# Patient Record
Sex: Female | Born: 1975 | Race: Black or African American | Hispanic: No | Marital: Single | Smoking: Current every day smoker
Health system: Southern US, Community
[De-identification: ages and names within clinical notes are randomized; demographics above are authoritative.]

## PROBLEM LIST (undated history)

## (undated) DIAGNOSIS — I10 Essential (primary) hypertension: Secondary | ICD-10-CM

## (undated) HISTORY — PX: TUBAL LIGATION: SHX77

## (undated) HISTORY — DX: Essential (primary) hypertension: I10

---

## 1997-04-12 ENCOUNTER — Inpatient Hospital Stay (HOSPITAL_COMMUNITY): Admission: AD | Admit: 1997-04-12 | Discharge: 1997-04-12 | Payer: Self-pay | Admitting: *Deleted

## 1997-04-14 ENCOUNTER — Other Ambulatory Visit: Admission: RE | Admit: 1997-04-14 | Discharge: 1997-04-14 | Payer: Self-pay | Admitting: Obstetrics and Gynecology

## 1997-04-14 ENCOUNTER — Other Ambulatory Visit: Admission: RE | Admit: 1997-04-14 | Discharge: 1997-04-14 | Payer: Self-pay | Admitting: Gynecology

## 1997-08-31 ENCOUNTER — Ambulatory Visit (HOSPITAL_COMMUNITY): Admission: RE | Admit: 1997-08-31 | Discharge: 1997-08-31 | Payer: Self-pay | Admitting: Obstetrics and Gynecology

## 1997-08-31 ENCOUNTER — Encounter: Payer: Self-pay | Admitting: Obstetrics and Gynecology

## 1997-11-23 ENCOUNTER — Inpatient Hospital Stay (HOSPITAL_COMMUNITY): Admission: AD | Admit: 1997-11-23 | Discharge: 1997-11-23 | Payer: Self-pay | Admitting: *Deleted

## 1997-11-24 ENCOUNTER — Inpatient Hospital Stay (HOSPITAL_COMMUNITY): Admission: AD | Admit: 1997-11-24 | Discharge: 1997-11-26 | Payer: Self-pay | Admitting: Obstetrics and Gynecology

## 1998-04-06 ENCOUNTER — Inpatient Hospital Stay (HOSPITAL_COMMUNITY): Admission: AD | Admit: 1998-04-06 | Discharge: 1998-04-06 | Payer: Self-pay | Admitting: Obstetrics & Gynecology

## 1998-04-08 ENCOUNTER — Inpatient Hospital Stay (HOSPITAL_COMMUNITY): Admission: AD | Admit: 1998-04-08 | Discharge: 1998-04-08 | Payer: Self-pay | Admitting: *Deleted

## 1998-10-05 ENCOUNTER — Emergency Department (HOSPITAL_COMMUNITY): Admission: EM | Admit: 1998-10-05 | Discharge: 1998-10-05 | Payer: Self-pay | Admitting: Emergency Medicine

## 2000-05-15 ENCOUNTER — Emergency Department (HOSPITAL_COMMUNITY): Admission: EM | Admit: 2000-05-15 | Discharge: 2000-05-15 | Payer: Self-pay | Admitting: Unknown Physician Specialty

## 2001-05-13 ENCOUNTER — Inpatient Hospital Stay (HOSPITAL_COMMUNITY): Admission: AD | Admit: 2001-05-13 | Discharge: 2001-05-13 | Payer: Self-pay | Admitting: Obstetrics and Gynecology

## 2001-09-15 ENCOUNTER — Other Ambulatory Visit: Admission: RE | Admit: 2001-09-15 | Discharge: 2001-09-15 | Payer: Self-pay | Admitting: Gynecology

## 2001-09-15 ENCOUNTER — Other Ambulatory Visit: Admission: RE | Admit: 2001-09-15 | Discharge: 2001-09-15 | Payer: Self-pay | Admitting: Obstetrics and Gynecology

## 2001-11-12 ENCOUNTER — Inpatient Hospital Stay (HOSPITAL_COMMUNITY): Admission: AD | Admit: 2001-11-12 | Discharge: 2001-11-12 | Payer: Self-pay | Admitting: Obstetrics & Gynecology

## 2002-03-13 ENCOUNTER — Inpatient Hospital Stay (HOSPITAL_COMMUNITY): Admission: AD | Admit: 2002-03-13 | Discharge: 2002-03-15 | Payer: Self-pay | Admitting: Obstetrics and Gynecology

## 2002-03-14 ENCOUNTER — Encounter (INDEPENDENT_AMBULATORY_CARE_PROVIDER_SITE_OTHER): Payer: Self-pay | Admitting: Specialist

## 2003-10-10 ENCOUNTER — Emergency Department (HOSPITAL_COMMUNITY): Admission: EM | Admit: 2003-10-10 | Discharge: 2003-10-10 | Payer: Self-pay | Admitting: Emergency Medicine

## 2003-11-09 ENCOUNTER — Emergency Department (HOSPITAL_COMMUNITY): Admission: EM | Admit: 2003-11-09 | Discharge: 2003-11-09 | Payer: Self-pay | Admitting: Family Medicine

## 2003-11-15 ENCOUNTER — Ambulatory Visit: Payer: Self-pay | Admitting: Internal Medicine

## 2003-11-21 ENCOUNTER — Ambulatory Visit: Payer: Self-pay | Admitting: Internal Medicine

## 2003-11-21 DIAGNOSIS — A5901 Trichomonal vulvovaginitis: Secondary | ICD-10-CM | POA: Insufficient documentation

## 2003-11-22 ENCOUNTER — Ambulatory Visit: Payer: Self-pay | Admitting: *Deleted

## 2003-12-13 ENCOUNTER — Ambulatory Visit: Payer: Self-pay | Admitting: Internal Medicine

## 2004-03-13 ENCOUNTER — Ambulatory Visit: Payer: Self-pay | Admitting: Internal Medicine

## 2004-03-16 ENCOUNTER — Ambulatory Visit: Payer: Self-pay | Admitting: Internal Medicine

## 2004-03-21 ENCOUNTER — Ambulatory Visit (HOSPITAL_COMMUNITY): Admission: RE | Admit: 2004-03-21 | Discharge: 2004-03-21 | Payer: Self-pay | Admitting: Internal Medicine

## 2004-03-23 ENCOUNTER — Ambulatory Visit: Payer: Self-pay | Admitting: Internal Medicine

## 2004-05-29 ENCOUNTER — Emergency Department (HOSPITAL_COMMUNITY): Admission: EM | Admit: 2004-05-29 | Discharge: 2004-05-29 | Payer: Self-pay | Admitting: Emergency Medicine

## 2005-10-18 ENCOUNTER — Emergency Department (HOSPITAL_COMMUNITY): Admission: EM | Admit: 2005-10-18 | Discharge: 2005-10-18 | Payer: Self-pay | Admitting: Emergency Medicine

## 2006-02-04 ENCOUNTER — Emergency Department (HOSPITAL_COMMUNITY): Admission: EM | Admit: 2006-02-04 | Discharge: 2006-02-04 | Payer: Self-pay | Admitting: Emergency Medicine

## 2006-02-24 ENCOUNTER — Ambulatory Visit: Payer: Self-pay | Admitting: Internal Medicine

## 2006-03-17 ENCOUNTER — Encounter (INDEPENDENT_AMBULATORY_CARE_PROVIDER_SITE_OTHER): Payer: Self-pay | Admitting: *Deleted

## 2006-03-17 ENCOUNTER — Ambulatory Visit: Payer: Self-pay | Admitting: Internal Medicine

## 2006-09-19 ENCOUNTER — Telehealth (INDEPENDENT_AMBULATORY_CARE_PROVIDER_SITE_OTHER): Payer: Self-pay | Admitting: *Deleted

## 2006-09-22 ENCOUNTER — Encounter (INDEPENDENT_AMBULATORY_CARE_PROVIDER_SITE_OTHER): Payer: Self-pay | Admitting: Internal Medicine

## 2006-09-22 DIAGNOSIS — F17200 Nicotine dependence, unspecified, uncomplicated: Secondary | ICD-10-CM | POA: Insufficient documentation

## 2006-09-24 ENCOUNTER — Ambulatory Visit: Payer: Self-pay | Admitting: Nurse Practitioner

## 2006-09-24 DIAGNOSIS — M7710 Lateral epicondylitis, unspecified elbow: Secondary | ICD-10-CM | POA: Insufficient documentation

## 2006-10-01 ENCOUNTER — Encounter (INDEPENDENT_AMBULATORY_CARE_PROVIDER_SITE_OTHER): Payer: Self-pay | Admitting: *Deleted

## 2007-04-22 ENCOUNTER — Ambulatory Visit: Payer: Self-pay | Admitting: Nurse Practitioner

## 2007-04-22 DIAGNOSIS — L219 Seborrheic dermatitis, unspecified: Secondary | ICD-10-CM | POA: Insufficient documentation

## 2007-04-23 ENCOUNTER — Encounter (INDEPENDENT_AMBULATORY_CARE_PROVIDER_SITE_OTHER): Payer: Self-pay | Admitting: Nurse Practitioner

## 2007-05-11 ENCOUNTER — Ambulatory Visit: Payer: Self-pay | Admitting: Nurse Practitioner

## 2007-05-11 DIAGNOSIS — R319 Hematuria, unspecified: Secondary | ICD-10-CM

## 2007-05-11 LAB — CONVERTED CEMR LAB
Ketones, urine, test strip: NEGATIVE
Nitrite: NEGATIVE
Specific Gravity, Urine: 1.025
WBC Urine, dipstick: NEGATIVE
pH: 5.5

## 2007-05-12 ENCOUNTER — Encounter (INDEPENDENT_AMBULATORY_CARE_PROVIDER_SITE_OTHER): Payer: Self-pay | Admitting: Nurse Practitioner

## 2007-05-15 ENCOUNTER — Encounter (INDEPENDENT_AMBULATORY_CARE_PROVIDER_SITE_OTHER): Payer: Self-pay | Admitting: Nurse Practitioner

## 2007-07-10 ENCOUNTER — Ambulatory Visit: Payer: Self-pay | Admitting: Nurse Practitioner

## 2007-07-10 DIAGNOSIS — I10 Essential (primary) hypertension: Secondary | ICD-10-CM

## 2007-07-10 DIAGNOSIS — B354 Tinea corporis: Secondary | ICD-10-CM | POA: Insufficient documentation

## 2007-07-20 ENCOUNTER — Encounter (INDEPENDENT_AMBULATORY_CARE_PROVIDER_SITE_OTHER): Payer: Self-pay | Admitting: Nurse Practitioner

## 2007-07-20 ENCOUNTER — Ambulatory Visit (HOSPITAL_COMMUNITY): Admission: RE | Admit: 2007-07-20 | Discharge: 2007-07-20 | Payer: Self-pay | Admitting: Internal Medicine

## 2007-07-29 ENCOUNTER — Ambulatory Visit: Payer: Self-pay | Admitting: Nurse Practitioner

## 2007-09-17 ENCOUNTER — Emergency Department (HOSPITAL_COMMUNITY): Admission: EM | Admit: 2007-09-17 | Discharge: 2007-09-17 | Payer: Self-pay | Admitting: Emergency Medicine

## 2007-12-15 ENCOUNTER — Ambulatory Visit: Payer: Self-pay | Admitting: Nurse Practitioner

## 2007-12-15 DIAGNOSIS — G4700 Insomnia, unspecified: Secondary | ICD-10-CM | POA: Insufficient documentation

## 2007-12-15 LAB — CONVERTED CEMR LAB
Bilirubin Urine: NEGATIVE
KOH Prep: NEGATIVE
Ketones, urine, test strip: NEGATIVE
Nitrite: NEGATIVE
Specific Gravity, Urine: 1.02
Urobilinogen, UA: 1
WBC Urine, dipstick: NEGATIVE
pH: 6

## 2007-12-16 ENCOUNTER — Encounter (INDEPENDENT_AMBULATORY_CARE_PROVIDER_SITE_OTHER): Payer: Self-pay | Admitting: Nurse Practitioner

## 2007-12-21 ENCOUNTER — Emergency Department (HOSPITAL_COMMUNITY): Admission: EM | Admit: 2007-12-21 | Discharge: 2007-12-21 | Payer: Self-pay | Admitting: Family Medicine

## 2007-12-22 ENCOUNTER — Encounter (INDEPENDENT_AMBULATORY_CARE_PROVIDER_SITE_OTHER): Payer: Self-pay | Admitting: Nurse Practitioner

## 2008-05-09 ENCOUNTER — Ambulatory Visit: Payer: Self-pay | Admitting: Nurse Practitioner

## 2008-05-09 ENCOUNTER — Encounter (INDEPENDENT_AMBULATORY_CARE_PROVIDER_SITE_OTHER): Payer: Self-pay | Admitting: Nurse Practitioner

## 2008-05-09 DIAGNOSIS — B373 Candidiasis of vulva and vagina: Secondary | ICD-10-CM

## 2008-05-09 LAB — CONVERTED CEMR LAB

## 2008-05-10 ENCOUNTER — Encounter (INDEPENDENT_AMBULATORY_CARE_PROVIDER_SITE_OTHER): Payer: Self-pay | Admitting: Nurse Practitioner

## 2008-10-16 ENCOUNTER — Emergency Department (HOSPITAL_COMMUNITY): Admission: EM | Admit: 2008-10-16 | Discharge: 2008-10-17 | Payer: Self-pay | Admitting: Emergency Medicine

## 2008-12-04 ENCOUNTER — Emergency Department (HOSPITAL_COMMUNITY): Admission: EM | Admit: 2008-12-04 | Discharge: 2008-12-04 | Payer: Self-pay | Admitting: Emergency Medicine

## 2009-03-06 ENCOUNTER — Ambulatory Visit: Payer: Self-pay | Admitting: Nurse Practitioner

## 2009-03-06 DIAGNOSIS — N760 Acute vaginitis: Secondary | ICD-10-CM | POA: Insufficient documentation

## 2009-03-06 LAB — CONVERTED CEMR LAB
Bilirubin Urine: NEGATIVE
Glucose, Urine, Semiquant: NEGATIVE
KOH Prep: NEGATIVE
Specific Gravity, Urine: <1.005
pH: 8

## 2009-04-27 ENCOUNTER — Telehealth (INDEPENDENT_AMBULATORY_CARE_PROVIDER_SITE_OTHER): Payer: Self-pay | Admitting: Nurse Practitioner

## 2009-05-18 ENCOUNTER — Ambulatory Visit: Payer: Self-pay | Admitting: Nurse Practitioner

## 2009-05-18 LAB — CONVERTED CEMR LAB
BUN: 5 mg/dL — ABNORMAL LOW (ref 6–23)
Basophils Relative: 1 % (ref 0–1)
Bilirubin Urine: NEGATIVE
CO2: 27 meq/L (ref 19–32)
Calcium: 10.1 mg/dL (ref 8.4–10.5)
Chloride: 97 meq/L (ref 96–112)
Cholesterol: 168 mg/dL (ref 0–200)
Creatinine, Ser: 0.66 mg/dL (ref 0.40–1.20)
Eosinophils Absolute: 0.1 10*3/uL (ref 0.0–0.7)
Eosinophils Relative: 1 % (ref 0–5)
Glucose, Urine, Semiquant: NEGATIVE
HCT: 36.7 % (ref 36.0–46.0)
HDL: 78 mg/dL (ref >39)
KOH Prep: NEGATIVE
Ketones, urine, test strip: NEGATIVE
LDL Cholesterol: 73 mg/dL (ref 0–99)
Lymphs Abs: 2.2 10*3/uL (ref 0.7–4.0)
MCHC: 32.2 g/dL (ref 30.0–36.0)
MCV: 87.6 fL (ref 78.0–100.0)
Microalb, Ur: 0.67 mg/dL (ref 0.00–1.89)
Monocytes Relative: 9 % (ref 3–12)
Platelets: 494 10*3/uL — ABNORMAL HIGH (ref 150–400)
Rapid HIV Screen: NEGATIVE
Specific Gravity, Urine: 1.01
TSH: 1.016 microintl units/mL (ref 0.350–4.500)
Total Bilirubin: 0.3 mg/dL (ref 0.3–1.2)
Total CHOL/HDL Ratio: 2.2
Urobilinogen, UA: 0.2
WBC: 6.5 10*3/uL (ref 4.0–10.5)
pH: 7

## 2009-05-22 ENCOUNTER — Encounter (INDEPENDENT_AMBULATORY_CARE_PROVIDER_SITE_OTHER): Payer: Self-pay | Admitting: Nurse Practitioner

## 2009-05-22 LAB — CONVERTED CEMR LAB: Pap Smear: NEGATIVE

## 2009-05-23 ENCOUNTER — Encounter (INDEPENDENT_AMBULATORY_CARE_PROVIDER_SITE_OTHER): Payer: Self-pay | Admitting: Nurse Practitioner

## 2009-06-15 ENCOUNTER — Encounter (INDEPENDENT_AMBULATORY_CARE_PROVIDER_SITE_OTHER): Payer: Self-pay | Admitting: Nurse Practitioner

## 2010-02-11 LAB — CONVERTED CEMR LAB
Albumin: 4.5 g/dL (ref 3.5–5.2)
Albumin: 4.8 g/dL (ref 3.5–5.2)
Alkaline Phosphatase: 46 units/L (ref 39–117)
Alkaline Phosphatase: 53 units/L (ref 39–117)
BUN: 8 mg/dL (ref 6–23)
BUN: 9 mg/dL (ref 6–23)
Basophils Absolute: 0 10*3/uL (ref 0.0–0.1)
Basophils Relative: 1 % (ref 0–1)
CO2: 20 meq/L (ref 19–32)
CO2: 24 meq/L (ref 19–32)
Cholesterol: 137 mg/dL (ref 0–200)
Eosinophils Absolute: 0.1 10*3/uL (ref 0.0–0.7)
Eosinophils Relative: 1 % (ref 0–5)
Eosinophils Relative: 2 % (ref 0–5)
Glucose, Bld: 74 mg/dL (ref 70–99)
Glucose, Bld: 94 mg/dL (ref 70–99)
HCT: 37.3 % (ref 36.0–46.0)
HCT: 39.5 % (ref 36.0–46.0)
HDL: 69 mg/dL (ref >39)
Hemoglobin: 11.8 g/dL — ABNORMAL LOW (ref 12.0–15.0)
Lymphocytes Relative: 31 % (ref 12–46)
Lymphs Abs: 2.1 10*3/uL (ref 0.7–4.0)
MCHC: 31.6 g/dL (ref 30.0–36.0)
MCV: 85.5 fL (ref 78.0–100.0)
Microalb, Ur: 0.8 mg/dL (ref 0.00–1.89)
Monocytes Absolute: 0.6 10*3/uL (ref 0.1–1.0)
Monocytes Relative: 10 % (ref 3–12)
Monocytes Relative: 9 % (ref 3–12)
Neutro Abs: 3.7 10*3/uL (ref 1.7–7.7)
Neutrophils Relative %: 62 % (ref 43–77)
Nitrite: NEGATIVE
Potassium: 4 meq/L (ref 3.5–5.3)
Potassium: 4.3 meq/L (ref 3.5–5.3)
Protein, U semiquant: NEGATIVE
RBC: 4.37 M/uL (ref 3.87–5.11)
RBC: 4.62 M/uL (ref 3.87–5.11)
RDW: 15.4 % (ref 11.5–15.5)
Sodium: 135 meq/L (ref 135–145)
Specific Gravity, Urine: 1.02
TSH: 0.7 microintl units/mL (ref 0.350–5.50)
Total Protein: 7.5 g/dL (ref 6.0–8.3)
Triglycerides: 102 mg/dL (ref <150)
Urobilinogen, UA: 0.2
Urobilinogen, UA: 0.2
WBC Urine, dipstick: NEGATIVE
WBC Urine, dipstick: NEGATIVE
WBC: 7.4 10*3/uL (ref 4.0–10.5)

## 2010-02-15 NOTE — Letter (Signed)
Summary: *HSN Results Follow up  [facility]  [address] [place], [place] [address]   Phone: [phone]  Fax: [phone]      06/15/2009   MAKYNLEE KRESSIN [address] [place], [place]  [address]   Dear  Ms. Tanya Nones,                            ____S.Drinkard,FNP   ____D. Gore,FNP       ____B. McPherson,MD   ____V. Rankins,MD    ____E. Mulberry,MD    _X___N. Daphine Deutscher, FNP  ____D. Reche Dixon, MD    ____K. Philipp Deputy, MD    ____Other     This letter is to inform you that your recent test(s):  __X_____Pap Smear    _______Lab Test     _______X-ray    ___X____ is within acceptable limits  _______ requires a medication change  _______ requires a follow-up lab visit  _______ requires a follow-up visit with your provider   Comments: Pap Smear results normal.       _________________________________________________________ If you have any questions, please contact our office [phone].                    Sincerely,    Lehman Prom FNP [facility]

## 2010-02-15 NOTE — Letter (Signed)
Summary: Handout Printed  Printed Handout:  - Bacterial Vaginosis-Brief 

## 2010-02-15 NOTE — Assessment & Plan Note (Signed)
Summary: Acute - Bacterial Vaginosis   Vital Signs:  Patient profile:   35 year old female LMP:     01/2009 Weight:      116.2 pounds BMI:     20.66 BSA:     1.54 Temp:     98.0 degrees F oral Pulse rate:   85 / minute Pulse rhythm:   regular Resp:     20 per minute BP sitting:   124 / 90  (left arm) Cuff size:   regular  Vitals Entered By: Levon Hedger (March 06, 2009 10:14 AM) CC: vaginal discharge and odor x 2 weeks...refill on BP meds, Hypertension Management Is Patient Diabetic? No Pain Assessment Patient in pain? no       Does patient need assistance? Functional Status Self care Ambulation Normal LMP (date): 01/2009 LMP - Character: heavy     Enter LMP: 01/2009 Last PAP Result  Specimen Adequacy: Satisfactory for evaluation.   Interpretation/Result:Negative for intraepithelial Lesion or Malignancy.      CC:  vaginal discharge and odor x 2 weeks...refill on BP meds and Hypertension Management.  History of Present Illness:  Pt into the office with complaints of vaginal discharge Started about 2 weeks ago. LMP - at the end of January, none for February yet. +odorous sometimes but not all the time +thin, watery discharge no need to wear pantyliners -abd pain -dysuria -hematuria   Notes that she is just getting over what she thinks was the flu on last week.  She did not get the Flu vaccine in 2010  Hypertension History:      She denies headache, chest pain, and palpitations.  She notes no problems with any antihypertensive medication side effects.  Pt has taken her BP meds today.  Further comments include: Pharmacy report indicates that pt she has not picked up lisinopril since 11/10 and original Rx sent on 12/2007.        Positive major cardiovascular risk factors include hypertension and current tobacco user.  Negative major cardiovascular risk factors include female age less than 60 years old, no history of diabetes, and negative family history for  ischemic heart disease.        Further assessment for target organ damage reveals no history of ASHD, cardiac end-organ damage (CHF/LVH), stroke/TIA, peripheral vascular disease, renal insufficiency, or hypertensive retinopathy.     Allergies (verified): No Known Drug Allergies  Review of Systems CV:  Denies chest pain or discomfort. Resp:  Denies cough. GI:  Denies abdominal pain, nausea, and vomiting. GU:  Complains of discharge; denies dysuria.  Physical Exam  General:  alert.   Head:  normocephalic.   Lungs:  normal breath sounds.   Heart:  normal rate and regular rhythm.   Abdomen:  normal bowel sounds.   Msk:  normal ROM.   Neurologic:  alert & oriented X3.   Skin:  color normal.   Psych:  Oriented X3.     Impression & Recommendations:  Problem # 1:  BACTERIAL VAGINITIS (ICD-616.10) Dx reviewed with pt Her updated medication list for this problem includes:    Metronidazole 0.75 % Gel (Metronidazole) ..... One applicator intravaginally x 5 nights  Orders: Wet Prep ([address]UE)  Problem # 2:  HYPERTENSION, BENIGN ESSENTIAL (ICD-401.1) DASH diet pt advised to take meds as ordered Her updated medication list for this problem includes:    Lisinopril 10 Mg Tabs (Lisinopril) .Marland Kitchen... 1 tablet by mouth daily  Complete Medication List: 1)  Lisinopril 10 Mg Tabs (Lisinopril) .Marland KitchenMarland KitchenMarland Kitchen  1 tablet by mouth daily 2)  Metronidazole 0.75 % Gel (Metronidazole) .... One applicator intravaginally x 5 nights  Other Orders: UA Dipstick w/o Micro (manual) (81191)  Hypertension Assessment/Plan:      The patient's hypertensive risk group is category B: At least one risk factor (excluding diabetes) with no target organ damage.  Her calculated 10 year risk of coronary heart disease is 1 %.  Today's blood pressure is 124/90.  Her blood pressure goal is < 140/90.  Patient Instructions: 1)  You complete physical exam will be due after May 09, 2009. 2)  Do not eat after midnight before this  visit. 3)  You will get labs, PAP, EKG, PHQ-9 4)  You have bacterial vaginosis 5)  Be sure to drink plenty of water 6)  white cotton underwear 7)  wipe from front to back 8)  go to the bathroom after intercouse 9)  DO not douche Prescriptions: METRONIDAZOLE 0.75 % GEL (METRONIDAZOLE) One applicator intravaginally x 5 nights  #45gm x 0   Entered and Authorized by:   Lehman Prom FNP   Signed by:   Lehman Prom FNP on 03/06/2009   Method used:   Print then Give to Patient   RxID:   [id number] LISINOPRIL 10 MG  TABS (LISINOPRIL) 1 tablet by mouth daily  #30 x 5   Entered and Authorized by:   Lehman Prom FNP   Signed by:   Lehman Prom FNP on 03/06/2009   Method used:   Print then Give to Patient   RxID:   [id number]   Laboratory Results   Urine Tests  Date/Time Received: March 06, 2009 10:38 AM   Routine Urinalysis   Color: lt. yellow Glucose: negative   (Normal Range: Negative) Bilirubin: negative   (Normal Range: Negative) Ketone: smal (15)   (Normal Range: Negative) Spec. Gravity: <1.005   (Normal Range: 1.003-1.035) Blood: trace-intact   (Normal Range: Negative) pH: 8.0   (Normal Range: 5.0-8.0) Protein: 30   (Normal Range: Negative) Urobilinogen: 0.2   (Normal Range: 0-1) Nitrite: negative   (Normal Range: Negative) Leukocyte Esterace: negative   (Normal Range: Negative)    Date/Time Received: March 06, 2009 10:56 AM   Wet Mount/KOH Source: vaginal WBC/hpf: 1-5 Bacteria/hpf: rare Clue cells/hpf: few Yeast/hpf: none Trichomonas/hpf: none   Laboratory Results   Urine Tests    Routine Urinalysis   Color: lt. yellow Glucose: negative   (Normal Range: Negative) Bilirubin: negative   (Normal Range: Negative) Ketone: smal (15)   (Normal Range: Negative) Spec. Gravity: <1.005   (Normal Range: 1.003-1.035) Blood: trace-intact   (Normal Range: Negative) pH: 8.0   (Normal Range: 5.0-8.0) Protein: 30   (Normal Range:  Negative) Urobilinogen: 0.2   (Normal Range: 0-1) Nitrite: negative   (Normal Range: Negative) Leukocyte Esterace: negative   (Normal Range: Negative)      Wet Mount Wet Mount KOH: Negative

## 2010-02-15 NOTE — Progress Notes (Signed)
Summary: Office Visit//DEPRESSION SCREENING  Office Visit//DEPRESSION SCREENING   Imported By: Arta Bruce 07/14/2009 11:27:50  _____________________________________________________________________  External Attachment:    Type:   Image     Comment:   External Document

## 2010-02-15 NOTE — Assessment & Plan Note (Signed)
Summary: Complete Physical Exam   Vital Signs:  Patient profile:   35 year old female LMP:     04/27/2009 Height:      63. inches Weight:      120.0 pounds Temp:     97.5 degrees F oral Pulse rate:   54 / minute Pulse rhythm:   regular Resp:     16 per minute BP sitting:   154 / 104  (left arm) Cuff size:   regular  Vitals Entered By: Levon Hedger (May 18, 2009 10:13 AM) CC: CPP...hot flashes...having some irregular spotting, Hypertension Management Is Patient Diabetic? No Pain Assessment Patient in pain? no       Does patient need assistance? Functional Status Self care Ambulation Normal LMP (date): 04/27/2009 LMP - Character: heavy     Enter LMP: 04/27/2009 Last PAP Result  Specimen Adequacy: Satisfactory for evaluation.   Interpretation/Result:Negative for intraepithelial Lesion or Malignancy.      CC:  CPP...hot flashes...having some irregular spotting and Hypertension Management.  History of Present Illness:  Pt into the office for a complete physical exam.  Pap - last year in this office.  normal.  No hx of abnormal PAP  no family hx of cervical or ovarian cancer  2 children - natural Menses - irreguar - never been regular since onset; no current birth control  Mammogram - no hx of mammogram no family hx of breast cancer self breast exams at home  Optho - wears glasses for driving. no recent eye exams.  dental - no recent dental exam  Tdap - up to date  Hypertension History:      She denies headache, chest pain, and palpitations.  She notes no problems with any antihypertensive medication side effects.  Pt has already taken BP meds today.  no bp checks at home.        Positive major cardiovascular risk factors include hypertension and current tobacco user.  Negative major cardiovascular risk factors include female age less than 39 years old, no history of diabetes, and negative family history for ischemic heart disease.        Further assessment  for target organ damage reveals no history of ASHD, cardiac end-organ damage (CHF/LVH), stroke/TIA, peripheral vascular disease, renal insufficiency, or hypertensive retinopathy.     Habits & Providers  Alcohol-Tobacco-Diet     Alcohol type: beer, wine     Tobacco Status: current     Tobacco Counseling: to quit use of tobacco products     Cigarette Packs/Day: 1/3  Exercise-Depression-Behavior     Does Patient Exercise: no     Exercise Counseling: to improve exercise regimen     Have you felt down or hopeless? no     Have you felt little pleasure in things? no     Depression Counseling: not indicated; screening negative for depression     Drug Use: no     Seat Belt Use: 100     Sun Exposure: occasionally  Comments: PHQ-9 score = 0  Allergies (verified): No Known Drug Allergies  Review of Systems General:  Complains of sweats; denies fever; mostly at night. Eyes:  Denies blurring. ENT:  Denies earache. CV:  Denies chest pain or discomfort. Resp:  Denies cough. GI:  Denies abdominal pain, nausea, and vomiting. GU:  Denies discharge. MS:  Denies joint pain. Derm:  Denies rash. Neuro:  Denies headaches. Psych:  Denies anxiety and depression.  Physical Exam  General:  alert.   Head:  normocephalic.  Eyes:  pupils reactive to light.   Ears:  ear piercing(s) noted.  bil TM with bony landmarks present Nose:  no nasal discharge.   Mouth:  fair dentition.  some missing teeth with some cavities Neck:  supple.   Chest Wall:  no mass.   Breasts:  no masses.   Lungs:  normal breath sounds.   Heart:  normal rate and regular rhythm.   Abdomen:  soft, non-tender, and normal bowel sounds.   Rectal:  defer Msk:  no limits Pulses:  R radial normal and L radial normal.   Extremities:  no edema Neurologic:  alert & oriented X3.   Skin:  discoloration to face - birth mark Psych:  Oriented X3.    Pelvic Exam  Vulva:      normal appearance.   Urethra and Bladder:       Urethra--no discharge.   Vagina:      physiologic discharge.   Cervix:      midposition.   Uterus:      smooth.   Adnexa:      nontender bilaterally.      Impression & Recommendations:  Problem # 1:  ROUTINE GYNECOLOGICAL EXAMINATION (ICD-V72.31) labs done PAP done self breast exam reinforced with pt PHQ- 9 score = 0 rec optho and dental exam Orders: T- GC Chlamydia (56213) KOH/ WET Mount [phone]) Pap Smear, Thin Prep ( Collection of) (Q4696) T-CBC w/Diff ([id number]) Rapid HIV  (40102) T-TSH ([id number])  Problem # 2:  HYPERTENSION, BENIGN ESSENTIAL (ICD-401.1) BP elevated today in office  usually well controlled advised pt to monitor discussed DASH diet Her updated medication list for this problem includes:    Lisinopril 10 Mg Tabs (Lisinopril) .Marland Kitchen... 1 tablet by mouth daily  Orders: UA Dipstick w/o Micro (manual) ([id number]) T-Urine Microalbumin w/creat. ratio [phone]) T-Lipid Profile [id number]) T-Comprehensive Metabolic Panel ([id number])  Problem # 3:  DISORDER, TOBACCO USE (ICD-305.1) advised pt to stop smoking  Complete Medication List: 1)  Lisinopril 10 Mg Tabs (Lisinopril) .Marland Kitchen.. 1 tablet by mouth daily  Hypertension Assessment/Plan:      The patient's hypertensive risk group is category B: At least one risk factor (excluding diabetes) with no target organ damage.  Her calculated 10 year risk of coronary heart disease is 1 %.  Today's blood pressure is 154/104.  Her blood pressure goal is < 140/90.  Patient Instructions: 1)  You will be notified of any abnormal labs. 2)  Sweating - may be some hormonal during the night. 3)  Be sure to continue to drink plenty of water 4)  Follow up at least every 6 months for your blood pressure or sooner if necessary  Laboratory Results   Urine Tests  Date/Time Received: May 18, 2009 10:22 AM   Routine Urinalysis   Color: lt. yellow Appearance: Clear Glucose: negative   (Normal Range:  Negative) Bilirubin: negative   (Normal Range: Negative) Ketone: negative   (Normal Range: Negative) Spec. Gravity: 1.010   (Normal Range: 1.003-1.035) Blood: small   (Normal Range: Negative) pH: 7.0   (Normal Range: 5.0-8.0) Protein: negative   (Normal Range: Negative) Urobilinogen: 0.2   (Normal Range: 0-1) Nitrite: negative   (Normal Range: Negative) Leukocyte Esterace: negative   (Normal Range: Negative)    Date/Time Received: May 18, 2009 11:29 AM   Wet Mount/KOH Source: vaginal WBC/hpf: 1-5 Bacteria/hpf: rare Clue cells/hpf: none Yeast/hpf: none Trichomonas/hpf: none  Other Tests  Rapid HIV: negative   Prevention & Chronic Care Immunizations  Influenza vaccine: Fluvax 3+  (12/15/2007)    Tetanus booster: 09/24/2006: Tdap    Pneumococcal vaccine: Not documented  Other Screening   Pap smear:  Specimen Adequacy: Satisfactory for evaluation.   Interpretation/Result:Negative for intraepithelial Lesion or Malignancy.     (05/09/2008)   Pap smear action/deferral: Ordered  (05/18/2009)   Pap smear due: 05/19/2010   Smoking status: current  (05/18/2009)   Smoking cessation counseling: yes  (07/10/2007)  Hypertension   Last Blood Pressure: 154 / 104  (05/18/2009)   Serum creatinine: 0.72  (05/09/2008)   Serum potassium 4.3  (05/09/2008) CMP ordered   Self-Management Support :   Personal Goals (by the next clinic visit) :      Personal blood pressure goal: 130/80  (05/18/2009)   Patient will work on the following items until the next clinic visit to reach self-care goals:     Medications and monitoring: check my blood pressure  (05/18/2009)     Eating: eat more vegetables  (05/18/2009)     Activity: take a 30 minute walk every day  (05/18/2009)    Hypertension self-management support: Not documented   Laboratory Results   Urine Tests    Routine Urinalysis   Color: lt. yellow Appearance: Clear Glucose: negative   (Normal Range:  Negative) Bilirubin: negative   (Normal Range: Negative) Ketone: negative   (Normal Range: Negative) Spec. Gravity: 1.010   (Normal Range: 1.003-1.035) Blood: small   (Normal Range: Negative) pH: 7.0   (Normal Range: 5.0-8.0) Protein: negative   (Normal Range: Negative) Urobilinogen: 0.2   (Normal Range: 0-1) Nitrite: negative   (Normal Range: Negative) Leukocyte Esterace: negative   (Normal Range: Negative)      Wet Mount Wet Mount KOH: Negative  Other Tests  Rapid HIV: negative    Appended Document: Complete Physical Exam     Serial Vital Signs/Assessments:  Time      Position  BP       Pulse  Resp  Temp     By           R Arm     163/117                        Levon Hedger   Allergies: No Known Drug Allergies   Complete Medication List: 1)  Lisinopril 10 Mg Tabs (Lisinopril) .Marland Kitchen.. 1 tablet by mouth daily

## 2010-02-15 NOTE — Progress Notes (Signed)
Summary: Sweating   Phone Note Call from Patient Call back at [facility] Phone [phone] Call back at [phone]   Summary of Call: The pt feels very hot recently and she doesn't know why. Please call her back. Martin TnP Initial call taken by: Manon Hilding,  April 27, 2009 5:00 PM  Follow-up for Phone Call        spoke with pt and she says she gets real hot while she is moving around (cleaning, washing dishes, etc.) and she has to lay down to cool off.... pt says it will take a minute before she gets hot again... pt says she does sweat when she gets hot... Follow-up by: [place] Shannon,  April 28, 2009 1:40 PM  Additional Follow-up for Phone Call Additional follow up Details #1::        I'm not sure what this could be from.... advise pt to drink plenty of water. avoid caffeine Additional Follow-up by: Lehman Prom FNP,  April 28, 2009 2:04 PM    Additional Follow-up for Phone Call Additional follow up Details #2::    Left message on answering machine for pt to call back.Marland KitchenMarland KitchenArmenia Shannon  May 01, 2009 3:37 PM  spoke with pt and she feels as if she needs to make appt since all she do is drink water... pt says she might drink a soda once daily.... [place] Shannon  May 02, 2009 3:07 PM

## 2010-04-18 LAB — CULTURE, ROUTINE-ABSCESS

## 2010-04-19 LAB — ETHANOL: Alcohol, Ethyl (B): 313 mg/dL — ABNORMAL HIGH (ref 0–10)

## 2010-04-19 LAB — RAPID URINE DRUG SCREEN, HOSP PERFORMED
Cocaine: NOT DETECTED
Opiates: NOT DETECTED
Tetrahydrocannabinol: NOT DETECTED

## 2010-04-19 LAB — COMPREHENSIVE METABOLIC PANEL
ALT: 9 U/L (ref 0–35)
Alkaline Phosphatase: 47 U/L (ref 39–117)
BUN: 7 mg/dL (ref 6–23)
CO2: 22 mEq/L (ref 19–32)
GFR calc non Af Amer: >60 mL/min (ref >60)
Glucose, Bld: 95 mg/dL (ref 70–99)
Potassium: 3.5 mEq/L (ref 3.5–5.1)
Sodium: 142 mEq/L (ref 135–145)
Total Bilirubin: 0.5 mg/dL (ref 0.3–1.2)

## 2010-04-19 LAB — POCT PREGNANCY, URINE: Preg Test, Ur: NEGATIVE

## 2010-04-19 LAB — POCT I-STAT, CHEM 8
Creatinine, Ser: 0.7 mg/dL (ref 0.4–1.2)
HCT: 39 % (ref 36.0–46.0)
Hemoglobin: 13.3 g/dL (ref 12.0–15.0)
Potassium: 3.4 mEq/L — ABNORMAL LOW (ref 3.5–5.1)
Sodium: 143 mEq/L (ref 135–145)

## 2010-04-19 LAB — DIFFERENTIAL
Basophils Absolute: 0 10*3/uL (ref 0.0–0.1)
Basophils Relative: 1 % (ref 0–1)
Eosinophils Absolute: 0 10*3/uL (ref 0.0–0.7)
Neutro Abs: 3.3 10*3/uL (ref 1.7–7.7)
Neutrophils Relative %: 48 % (ref 43–77)

## 2010-04-19 LAB — URINALYSIS, ROUTINE W REFLEX MICROSCOPIC
Bilirubin Urine: NEGATIVE
Specific Gravity, Urine: 1.007 (ref 1.005–1.030)
Urobilinogen, UA: 0.2 mg/dL (ref 0.0–1.0)
pH: 6 (ref 5.0–8.0)

## 2010-04-19 LAB — POCT CARDIAC MARKERS: Myoglobin, poc: 105 ng/mL (ref 12–200)

## 2010-04-19 LAB — CBC
HCT: 34.9 % — ABNORMAL LOW (ref 36.0–46.0)
Hemoglobin: 11.6 g/dL — ABNORMAL LOW (ref 12.0–15.0)
MCHC: 33.3 g/dL (ref 30.0–36.0)
RBC: 3.92 MIL/uL (ref 3.87–5.11)
RDW: 15.9 % — ABNORMAL HIGH (ref 11.5–15.5)

## 2010-04-19 LAB — URINE MICROSCOPIC-ADD ON

## 2010-04-19 LAB — LIPASE, BLOOD: Lipase: 54 U/L (ref 11–59)

## 2010-04-19 LAB — D-DIMER, QUANTITATIVE: D-Dimer, Quant: 0.72 ug/mL-FEU — ABNORMAL HIGH (ref 0.00–0.48)

## 2010-06-01 NOTE — Op Note (Signed)
NAME:  Erin Case, Erin Case                      ACCOUNT NO.:  [id number]   MEDICAL RECORD NO.:  [id number]                   PATIENT TYPE:  INP   LOCATION:  9160                                 FACILITY:  [facility]   PHYSICIAN:  Randye Lobo, M.D.                DATE OF BIRTH:  1975-10-17   DATE OF PROCEDURE:  03/13/2002  DATE OF DISCHARGE:                                 OPERATIVE REPORT   PREOPERATIVE DIAGNOSES:  1. Intrauterine gestation at 40+ four weeks.  2. Nonreassuring fetal assessment.   POSTOPERATIVE DIAGNOSES:  1. Intrauterine gestation at 40+ four weeks.  2. Nonreassuring fetal assessment.   PROCEDURE:  Vacuum-assisted vaginal delivery, with midline episiotomy and  rupture.   ANESTHESIA:  Epidural, local with 1% lidocaine.   ESTIMATED BLOOD LOSS:  400 cc.   COMPLICATIONS:  None.   INDICATIONS FOR PROCEDURE:  The patient is a 35 year old gravida 2, para 1-0-  0-1 African-American female; who presented to the [facility] at 40+  four weeks gestation, early in the morning of 03/13/02 with contractions.  The patient was noted to have spontaneous rupture of membranes during her  initial vaginal examination in the Triage area, at which time the cervix was  5 cm dilated, 90% effaced, and with the vertex at the -1 station.  The  amniotic fluid was noted to be clear.  The patient was admitted in labor.  She went on to receive an epidural for anesthesia.  The patient was having  mild to moderate variable decelerations of the fetal heart tracing, and an  amnio infusion was therefore performed.  A variable subsequently resolved.  The patient had Pitocin augmentation of her labor as her contractions were  spacing out to  5 and 6 minutes apart.   The patient then rapidly progressed to complete dilation and the fetal heart  rate tracing developed severe variable decelerations with pushing.  The  Foley catheter, which had been previously placed, was removed.  The patient  was examined and the vertex was noted to be at the 3+ station.  A  recommendation was made by the obstetrician to proceed with a vacuum-  assisted vaginal delivery with episiotomy.   FINDINGS:  A viable female was delivered at 10 a.m., with Apgars of 9 at one  minute and 9 at five minutes.  A nuchal cord x1 was noted.  The umbilical  cord was noted to be long and thin, and had a normal insertion into the  placenta.  The cord pH was later noted to be 7.15.   DESCRIPTION OF PROCEDURE:  Local 1% lidocaine was injected to the perineum  and a midline episiotomy was performed.  The  bell-shaped Mighty Vac was inserted over the fetal vertex at the 3+ station,  and the fetal vertex was delivered over one contraction with no pop offs.  The nuchal cord was reduced and the remainder of the infant was  delivered.  The cord was doubly clamped and cut, and the nares and mouth were suctioned  of the newborn.  The infant was carried over the baby warmer in vigorous  condition.  The cord pH and cord blood were collected, and the placenta was  then delivered spontaneously.   The cervix and vagina were examined and found to have no lacerations.  The  episiotomy was performed in standard fashion with 2-0 Vicryl, after  reinjection locally on the perineum with 1% lidocaine.   This concluded the patient's procedure.  The procedure was tolerated well by  the mother and the newborn.  There were no complications.  All sponge,  needle and instrument counts were correct.                                               Randye Lobo, M.D.    BES/MEDQ  D:  03/13/2002  T:  03/13/2002  Job:  [id number]

## 2010-06-01 NOTE — Op Note (Signed)
NAME:  Erin Case, Erin Case NO.:  [id number]   MEDICAL RECORD NO.:  [id number]                   PATIENT TYPE:  INP   LOCATION:  9147                                 FACILITY:  [facility]   PHYSICIAN:  Randye Lobo, M.D.                DATE OF BIRTH:  [birth year]   DATE OF PROCEDURE:  03/14/2002  DATE OF DISCHARGE:                                 OPERATIVE REPORT   PREOPERATIVE DIAGNOSES:  1. Multiparous female, status post vacuum-assisted vaginal delivery.  2. Desire for permanent sterilization.   POSTOPERATIVE DIAGNOSES:  1. Multiparous female, status post vacuum-assisted vaginal delivery.  2. Desire for permanent sterilization.   PROCEDURE:  Postpartum tubal ligation via modified Pomeroy technique.   SURGEON:  Randye Lobo, M.D.   ANESTHESIA:  Epidural.   INTRAVENOUS FLUIDS:  350 cc of Ringer's lactate.   ESTIMATED BLOOD LOSS:  Minimal.   URINE OUTPUT:  10 cc.   COMPLICATIONS:  None.   INDICATIONS FOR PROCEDURE:  The patient is a 35 year old, gravida 2, para 1,  0-0-1 African-American female, status post vacuum-assisted vaginal delivery  at the [facility] on 03/13/02, who throughout her pregnancy and  postpartum requested permanent sterilization.  During the patient's  antepartum course and after the delivery,  the patient was counseled  regarding risks, benefits, and alternatives to permanent sterilization, and  she chose to proceed.   The patient was quoted a failure rate of approximately 1:250 to 1:300 which  may result in either an intrauterine or an ectopic pregnancy.   FINDINGS:  The patient was noted to hae normal fallopian tubes bilaterally  and normal ovaries.   SPECIMENS:  A portion of both the right and left fallopian tube were sent to  pathology.   DESCRIPTION OF PROCEDURE:  With an IV in place, and an epidural catheter in  place, the patient was taken to the operating room after she was properly re-  identified.  The  patient had dosing of her epidural for adequate surgical  anesthesia.   The patient was placed in the supine position, and the abdomen was sterilely  prepped, and a Foley catheter sterilely placed inside the bladder.  She was  then sterilely draped.   An infraumbilical incision was created sharply with a scalpel.  The incision  was carried down to the fascia with a combination of both sharp and blunt  dissection.  The fascia was then grasped with two Kocher clamps and was  incised transversely.  Sharp entry into the peritoneal cavity occurred at  this time.  Digital examination in the peritoneal cavity documented the  absence of adhesions in this region.  A moistened lap pad was then placed in  the abdominal cavity, and appendiceal retractors were used to identify the  left fallopian tube.  The fallopian tube was grasped with Babcock clamps and  followed to its fimbriated end.  The mid-portion of the fallopian  tube was  grasped, and a free tie of 0 plain gut suture was placed creating a knuckle  of tissue.  The Metzenbaum scissors was used to come through the  mesosalpinx, and an additional tie of 2-0 plain was placed at the base of  each knuckle of tissue.  The intervening portion was sharply incised and was  sent to pathology.  Hemostasis was excellent.  The same procedure that was  performed on the patient's left-hand side was then repeated on the right-  hand side, after that fallopian tube was grasped and followed all the way to  its fimbriated end.  Hemostasis again was again excellent on this side.  The  lap pad was removed from the peritoneal cavity.   The abdomen was closed at this time.  The patient was noted to have  essentially no preperitoneal fat between the peritoneum and the fascia.  It  was closed continuously with one suture of 0 Vicryl.  The abdominal wall in  this region was noted to be thin but intact.  After closure of the fascia, a  subcuticular closure of 4-0  Vicryl was used for closure of the skin.  The  incision was injected with 5 cc of 0.25% Marcaine, and a sterile bandage was  placed over the incision.  The patient was cleansed of remaining Betadine,  and she was taken to the recovery room in stable and awake condition.  There  were no complications to the procedure.  All needle counts, instrument  counts, and sponge counts were correct.                                               Randye Lobo, M.D.    BES/MEDQ  D:  03/14/2002  T:  03/14/2002  Job:  [id number]

## 2010-06-01 NOTE — Discharge Summary (Signed)
   NAME:  DANISA, KOPEC                      ACCOUNT NO.:  [id number]   MEDICAL RECORD NO.:  [id number]                   PATIENT TYPE:  INP   LOCATION:  9147                                 FACILITY:  [facility]   PHYSICIAN:  Ilda Mori, M.D.                DATE OF BIRTH:  12-26-1975   DATE OF ADMISSION:  03/13/2002  DATE OF DISCHARGE:  03/15/2002                                 DISCHARGE SUMMARY   FINAL DIAGNOSES:  1. Intrauterine pregnancy at 40-4/[redacted] weeks gestation.  2. Non reassuring fetal assessment.  3. Vacuum assisted vaginal delivery of a female infant with Apgar's of 9 and     9.  4. Postpartum tubal sterilization procedure.   COMPLICATIONS:  None.   HOSPITAL COURSE:  This 35 year old G2, P1, 0, 0, 1 presents at 40-4/[redacted] weeks  gestation with contractions and spontaneous rupture of membranes.  The  patient's antepartum course had been complicated by late prenatal care, with  history HSV, and a smoker which patient did state that she quit during  pregnancy.  The patient's labor course continued, she dilated to complete  and complete, severe variables were noted and patient began pushing.  At  this point a midline episiotomy and vacuum assisted vaginal delivery was  performed.  The patient had a good delivery of a 6 pound, 13 ounce female  infant with Apgar's of 9 and 9.  There was a nuchal cord times one noted.  Episiotomy was repaired.  After delivery patient still expressed her desire  for a tubal ligation and she was scheduled for this on the morning of  03/14/02.  The delivery was performed by Dr. Conley Simmonds and on 03/14/02 the  patient was also taken to the operating room by Dr. Conley Simmonds where a  postpartum tubal ligation was performed without complication.  The patient's  postpartum and postoperative course was complicated by some mild  postoperative anemia.  She was felt ready for discharge on postpartum day  number two.   DISPOSITION:  She was sent home on a  regular diet, told to decrease  activities, to continue prenatal vitamins and FeSO4.  She was given a  prescription for Tylox #20 1-2 q.4h p.r.n. for pain, and to follow up in the  office in four weeks.   LABS ON DISCHARGE:  The patient had a hemoglobin of 9.3, a white blood cell  count of 15.4 thousand.     Leilani Able, P.A.-C.                Ilda Mori, M.D.    MB/MEDQ  D:  04/08/2002  T:  04/09/2002  Job:  [id number]

## 2010-10-17 LAB — POCT URINALYSIS DIP (DEVICE)
Ketones, ur: NEGATIVE
Protein, ur: NEGATIVE
Specific Gravity, Urine: 1.02
pH: 5

## 2010-10-17 LAB — POCT PREGNANCY, URINE: Preg Test, Ur: NEGATIVE

## 2010-10-17 LAB — WET PREP, GENITAL

## 2010-10-17 LAB — GC/CHLAMYDIA PROBE AMP, GENITAL: GC Probe Amp, Genital: NEGATIVE

## 2010-10-19 LAB — POCT RAPID STREP A: Streptococcus, Group A Screen (Direct): NEGATIVE

## 2010-10-19 LAB — POCT INFECTIOUS MONO SCREEN: Mono Screen: NEGATIVE

## 2012-10-02 ENCOUNTER — Emergency Department (HOSPITAL_COMMUNITY)
Admission: EM | Admit: 2012-10-02 | Discharge: 2012-10-02 | Disposition: A | Discharge status: Home / Self Care | Payer: BC Managed Care – PPO | Attending: Emergency Medicine | Admitting: Emergency Medicine

## 2012-10-02 ENCOUNTER — Emergency Department (HOSPITAL_COMMUNITY): Payer: BC Managed Care – PPO

## 2012-10-02 ENCOUNTER — Encounter (HOSPITAL_COMMUNITY): Payer: Self-pay | Admitting: Emergency Medicine

## 2012-10-02 DIAGNOSIS — Y9389 Activity, other specified: Secondary | ICD-10-CM | POA: Insufficient documentation

## 2012-10-02 DIAGNOSIS — F17200 Nicotine dependence, unspecified, uncomplicated: Secondary | ICD-10-CM | POA: Insufficient documentation

## 2012-10-02 DIAGNOSIS — S92919A Unspecified fracture of unspecified toe(s), initial encounter for closed fracture: Secondary | ICD-10-CM | POA: Insufficient documentation

## 2012-10-02 DIAGNOSIS — S92401A Displaced unspecified fracture of right great toe, initial encounter for closed fracture: Secondary | ICD-10-CM

## 2012-10-02 DIAGNOSIS — Y929 Unspecified place or not applicable: Secondary | ICD-10-CM | POA: Insufficient documentation

## 2012-10-02 DIAGNOSIS — IMO0002 Reserved for concepts with insufficient information to code with codable children: Secondary | ICD-10-CM | POA: Insufficient documentation

## 2012-10-02 MED ORDER — ACETAMINOPHEN 325 MG PO TABS
650.0000 mg | ORAL_TABLET | Freq: Once | ORAL | Status: AC
  Administered 2012-10-02: 650 mg via ORAL
  Filled 2012-10-02: qty 2

## 2012-10-02 MED ORDER — MORPHINE SULFATE 4 MG/ML IJ SOLN: 4.0000 mg | Freq: Once | INTRAMUSCULAR | Status: DC

## 2012-10-02 MED ORDER — HYDROCODONE-ACETAMINOPHEN 5-325 MG PO TABS: 1.0000 | ORAL_TABLET | ORAL | Status: DC | PRN

## 2012-10-02 NOTE — ED Provider Notes (Signed)
Medical screening examination/treatment/procedure(s) were performed by non-physician practitioner and as supervising physician I was immediately available for consultation/collaboration.  Lyanne Co, MD 10/02/12 [phone]

## 2012-10-02 NOTE — ED Notes (Signed)
Per pt, was playing with kids last night and she injured left large toe-swollen and painful

## 2012-10-02 NOTE — ED Provider Notes (Signed)
CSN: [id number]     Arrival date & time 10/02/12  1336 History   First MD Initiated Contact with Patient 10/02/12 1343     Chief Complaint  Patient presents with  . left toe pain    (Consider location/radiation/quality/duration/timing/severity/associated sxs/prior Treatment) HPI  Patient presents to the emergency department for evaluation of her left great toe injury. Last night her teenage kids were wrestling and fell right onto her toe. The toe bled at that time she placed a Band-Aid over it. She has been up at work 7 AM standing on her feet and could no longer take the pain and decided to come to the ER for further evaluation. In triage her low pressures noted to be 180/120. Patient states that she is to take blood pressure medication but has been off of it for 3 years due to the physician told her she no longer needed. She says that hospital that her anxious and she is in a lot of pain and maybe that's why her blood pressure is elevated. She denies having any chest pain, short of breath, wheezing, lightheadedness or headache.  History reviewed. No pertinent past medical history. History reviewed. No pertinent past surgical history. No family history on file. History  Substance Use Topics  . Smoking status: Current Every Day Smoker  . Smokeless tobacco: Not on file  . Alcohol Use: Yes   OB History   Grav Para Term Preterm Abortions TAB SAB Ect Mult Living                 Review of Systems The patient denies anorexia, fever, weight loss,, vision loss, decreased hearing, hoarseness, chest pain, syncope, dyspnea on exertion, peripheral edema, balance deficits, hemoptysis, abdominal pain, melena, hematochezia, severe indigestion/heartburn, hematuria, incontinence, genital sores, muscle weakness, suspicious skin lesions, transient blindness, difficulty walking, depression, unusual weight change, abnormal bleeding, enlarged lymph nodes, angioedema, and breast masses.  Allergies  Review of  patient's allergies indicates no known allergies.  Home Medications   Current Outpatient Rx  Name  Route  Sig  Dispense  Refill  . Aspirin-Salicylamide-Caffeine (BC HEADACHE POWDER PO)   Oral   Take 1 Package by mouth as needed (pain).         Marland Kitchen HYDROcodone-acetaminophen (NORCO/VICODIN) 5-325 MG per tablet   Oral   Take 1-2 tablets by mouth every 4 (four) hours as needed for pain.   20 tablet   0    BP 180/120  Pulse 80  Temp(Src) 98.2 F (36.8 C) (Oral)  Resp 18  SpO2 100%  LMP 09/12/2012 Physical Exam  Nursing note and vitals reviewed. Constitutional: She appears well-developed and well-nourished. No distress.  HENT:  Head: Normocephalic and atraumatic.  Eyes: Pupils are equal, round, and reactive to light.  Neck: Normal range of motion. Neck supple.  Cardiovascular: Normal rate and regular rhythm.   Pulmonary/Chest: Effort normal.  Abdominal: Soft.  Musculoskeletal:       Left foot: She exhibits decreased range of motion, tenderness, bony tenderness and swelling. She exhibits normal capillary refill, no crepitus, no deformity and no laceration.       Feet:  Neurological: She is alert.  Skin: Skin is warm and dry.    ED Course  Procedures (including critical care time) Labs Review Labs Reviewed - No data to display Imaging Review Dg Toe Great Left  10/02/2012   CLINICAL DATA:  Pain post injury last p.m.  EXAM: LEFT GREAT TOE  COMPARISON:  None.  FINDINGS: Three views of  the right 1st toe submitted. There is nondisplaced fracture proximal metaphysis distal phalanx left great toe.  IMPRESSION: Nondisplaced fracture distal phalanx left great toe.   Electronically Signed   By: Natasha Mead   On: 10/02/2012 14:26    MDM   1. Fractured great toe, right, closed, initial encounter    Xray shows fracture proximal to distal phalanx of left toe. Pt has a small scratch on to that bled but it is very superficial and is not consistent with an open fracture. The fracture is  also not displaced. Buddy tape, post op boot and crutches given. Long discussion with patient how important it is for follow-up of this kind of fracture. Pt could have disability if the fracture doesn't heal well. She reassures me that she understands and will follow-up with referred provider.  Rx: Vicodin. Advised to take Ibuprofen and Vicodin as needed.  37 y.o.Jerrianne D Tuberville's evaluation in the Emergency Department is complete. It has been determined that no acute conditions requiring further emergency intervention are present at this time. The patient/guardian have been advised of the diagnosis and plan. We have discussed signs and symptoms that warrant return to the ED, such as changes or worsening in symptoms.  Vital signs are stable at discharge. Filed Vitals:   10/02/12 1355  BP: 180/120  Pulse: 80  Temp: 98.2 F (36.8 C)  Resp: 18    Patient/guardian has voiced understanding and agreed to follow-up with the PCP or specialist.     Dorthula Matas, PA-C 10/02/12 1455

## 2012-10-13 ENCOUNTER — Ambulatory Visit (INDEPENDENT_AMBULATORY_CARE_PROVIDER_SITE_OTHER): Payer: BC Managed Care – PPO | Admitting: Family Medicine

## 2012-10-13 VITALS — BP 160/100 | HR 82 | Temp 97.9°F | Resp 18 | Ht 63.0 in | Wt 129.2 lb

## 2012-10-13 DIAGNOSIS — I10 Essential (primary) hypertension: Secondary | ICD-10-CM

## 2012-10-13 DIAGNOSIS — Z01419 Encounter for gynecological examination (general) (routine) without abnormal findings: Secondary | ICD-10-CM

## 2012-10-13 DIAGNOSIS — A5901 Trichomonal vulvovaginitis: Secondary | ICD-10-CM

## 2012-10-13 DIAGNOSIS — F17200 Nicotine dependence, unspecified, uncomplicated: Secondary | ICD-10-CM

## 2012-10-13 DIAGNOSIS — Z0000 Encounter for general adult medical examination without abnormal findings: Secondary | ICD-10-CM

## 2012-10-13 LAB — POCT CBC
Granulocyte percent: 62 %G (ref 37–80)
HCT, POC: 41.2 % (ref 37.7–47.9)
MCV: 91.3 fL (ref 80–97)
POC Granulocyte: 5 (ref 2–6.9)
Platelet Count, POC: 380 10*3/uL (ref 142–424)
RBC: 4.51 M/uL (ref 4.04–5.48)

## 2012-10-13 LAB — COMPREHENSIVE METABOLIC PANEL
Albumin: 4.3 g/dL (ref 3.5–5.2)
Alkaline Phosphatase: 42 U/L (ref 39–117)
BUN: 7 mg/dL (ref 6–23)
CO2: 27 mEq/L (ref 19–32)
Glucose, Bld: 76 mg/dL (ref 70–99)
Potassium: 4.1 mEq/L (ref 3.5–5.3)
Total Protein: 7.5 g/dL (ref 6.0–8.3)

## 2012-10-13 LAB — RPR

## 2012-10-13 LAB — LIPID PANEL
Cholesterol: 123 mg/dL (ref 0–200)
HDL: 56 mg/dL (ref >39)
LDL Cholesterol: 48 mg/dL (ref 0–99)
Triglycerides: 97 mg/dL (ref <150)

## 2012-10-13 LAB — POCT WET PREP WITH KOH: Trichomonas, UA: POSITIVE

## 2012-10-13 LAB — HEPATITIS C ANTIBODY: HCV Ab: NEGATIVE

## 2012-10-13 MED ORDER — METRONIDAZOLE 500 MG PO TABS: 500.0000 mg | ORAL_TABLET | Freq: Two times a day (BID) | ORAL | Status: DC

## 2012-10-13 MED ORDER — LISINOPRIL-HYDROCHLOROTHIAZIDE 20-12.5 MG PO TABS: 1.0000 | ORAL_TABLET | Freq: Every day | ORAL | Status: DC

## 2012-10-13 NOTE — Progress Notes (Signed)
Subjective:    Patient ID: Erin Case, female    DOB: [birth year], 37 y.o.   MRN: [id number] Chief Complaint  Patient presents with  . Annual Exam  . Vaginal Discharge    HPI  Used to be on BP medications previously but has not been on any in 1-2 yrs since [facility]. Just got insurance back. Does not check BP outside of office. Is fasting today. Using condoms and tubal for birth control. Last pap smear was about 2 yrs ago.  No h/o precancerous changes or abnormal pap smears. Has a h/o HSV with a painful bump and within 1-2d it will be gone so has never needed medication other than during pregnancy. Smoking 5 cigs/d - has lessened over the years.  Has never tried to quit - knows she should but not really interested in it now.  Past Medical History  Diagnosis Date  . Hypertension    Current Outpatient Prescriptions on File Prior to Visit  Medication Sig Dispense Refill  . Aspirin-Salicylamide-Caffeine (BC HEADACHE POWDER PO) Take 1 Package by mouth as needed (pain).      Marland Kitchen HYDROcodone-acetaminophen (NORCO/VICODIN) 5-325 MG per tablet Take 1-2 tablets by mouth every 4 (four) hours as needed for pain.  20 tablet  0   No current facility-administered medications on file prior to visit.   No Known Allergies History reviewed. No pertinent past surgical history. History reviewed. No pertinent family history. History   Social History  . Marital Status: Single    Spouse Name: N/A    Number of Children: N/A  . Years of Education: N/A   Social History Main Topics  . Smoking status: Current Every Day Smoker  . Smokeless tobacco: None  . Alcohol Use: Yes  . Drug Use: No  . Sexual Activity: None   Other Topics Concern  . None   Social History Narrative  . None    Review of Systems  Genitourinary: Positive for vaginal discharge.  All other systems reviewed and are negative.      BP 160/100  Pulse 82  Temp(Src) 97.9 F (36.6 C) (Oral)  Resp 18  Ht 5\' 3"  (1.6  m)  Wt 129 lb 3.2 oz (58.605 kg)  BMI 22.89 kg/m2  SpO2 100%  LMP 09/09/2012 Objective:   Physical Exam  Constitutional: She is oriented to person, place, and time. She appears well-developed and well-nourished. No distress.  HENT:  Head: Normocephalic and atraumatic.  Right Ear: Tympanic membrane, external ear and ear canal normal.  Left Ear: Tympanic membrane, external ear and ear canal normal.  Nose: No mucosal edema or rhinorrhea.  Mouth/Throat: Uvula is midline, oropharynx is clear and moist and mucous membranes are normal. No posterior oropharyngeal erythema.  Eyes: Conjunctivae and EOM are normal. Pupils are equal, round, and reactive to light. Right eye exhibits no discharge. Left eye exhibits no discharge. No scleral icterus.  Neck: Normal range of motion. Neck supple. No thyromegaly present.  Cardiovascular: Normal rate, regular rhythm, normal heart sounds and intact distal pulses.   Pulmonary/Chest: Effort normal and breath sounds normal. No respiratory distress.  Abdominal: Soft. Bowel sounds are normal. There is no tenderness.  Genitourinary: Uterus normal. No breast swelling, tenderness, discharge or bleeding. Cervix exhibits friability. Cervix exhibits no motion tenderness. Right adnexum displays no mass and no tenderness. Left adnexum displays no mass and no tenderness. Vaginal discharge found.  Moderate amount of thin white vag discharge  Musculoskeletal: She exhibits no edema.  Lymphadenopathy:  She has no cervical adenopathy.  Neurological: She is alert and oriented to person, place, and time. She has normal reflexes.  Skin: Skin is warm and dry. She is not diaphoretic. No erythema.  Psychiatric: She has a normal mood and affect. Her behavior is normal.      Results for orders placed in visit on 10/13/12  POCT CBC      Result Value Range   WBC 8.0  4.6 - 10.2 K/uL   Lymph, poc 2.6  0.6 - 3.4   POC LYMPH PERCENT 32.4  10 - 50 %L   MID (cbc) 0.4  0 - 0.9   POC  MID % 5.6  0 - 12 %M   POC Granulocyte 5.0  2 - 6.9   Granulocyte percent 62.0  37 - 80 %G   RBC 4.51  4.04 - 5.48 M/uL   Hemoglobin 12.9  12.2 - 16.2 g/dL   HCT, POC 69.6  29.5 - 47.9 %   MCV 91.3  80 - 97 fL   MCH, POC 28.6  27 - 31.2 pg   MCHC 31.3 (*) 31.8 - 35.4 g/dL   RDW, POC 28.4     Platelet Count, POC 380  142 - 424 K/uL   MPV 8.1  0 - 99.8 fL  POCT WET PREP WITH KOH      Result Value Range   Trichomonas, UA Positive     Clue Cells Wet Prep HPF POC 2-6     Epithelial Wet Prep HPF POC 4-12w/clums     Yeast Wet Prep HPF POC neg     Bacteria Wet Prep HPF POC 2+     RBC Wet Prep HPF POC 0-1     WBC Wet Prep HPF POC 2-5     KOH Prep POC Negative     Assessment & Plan:  DISORDER, TOBACCO USE - Plan: POCT CBC, POCT Wet Prep with KOH, Comprehensive metabolic panel, Lipid panel, TSH, HIV antibody, Hepatitis C antibody, RPR, Pap IG, CT/NG NAA, and HPV (high risk) - encouraged cessation, pt contemplative but not ready at this time  HYPERTENSION, BENIGN ESSENTIAL - Plan: POCT CBC, POCT Wet Prep with KOH, Comprehensive metabolic panel, Lipid panel, TSH, HIV antibody, Hepatitis C antibody, RPR, Pap IG, CT/NG NAA, and HPV (high risk) - start lisinopril-hctz and recheck at 104 in 1 month  Routine general medical examination at a health care facility - Plan: POCT CBC, POCT Wet Prep with KOH, Comprehensive metabolic panel, Lipid panel, TSH, HIV antibody, Hepatitis C antibody, RPR, Pap IG, CT/NG NAA, and HPV (high risk)  Encounter for routine gynecological examination  VULVOVAGINITIS, TRICHOMONAL - flagyl, have partner treated as well to prevent re-infection  Meds ordered this encounter  Medications  . lisinopril-hydrochlorothiazide (ZESTORETIC) 20-12.5 MG per tablet    Sig: Take 1 tablet by mouth daily.    Dispense:  90 tablet    Refill:  0  . metroNIDAZOLE (FLAGYL) 500 MG tablet    Sig: Take 1 tablet (500 mg total) by mouth 2 (two) times daily with a meal. DO NOT CONSUME ALCOHOL  WHILE TAKING THIS MEDICATION.    Dispense:  14 tablet    Refill:  0

## 2012-10-13 NOTE — Patient Instructions (Signed)
Keeping You Healthy  Get These Tests 1. Blood Pressure- Have your blood pressure checked once a year by your health care provider.  Normal blood pressure is 120/80. 2. Weight- Have your body mass index (BMI) calculated to screen for obesity.  BMI is measure of body fat based on height and weight.  You can also calculate your own BMI at https://www.west-esparza.com/. 3. Cholesterol- Have your cholesterol checked every 5 years starting at age 38 then yearly starting at age 72. 4. Chlamydia, HIV, and other sexually transmitted diseases- Get screened every year until age 35, then within three months of each new sexual provider. 5. Pap Smear- Every 1-3 years; discuss with your health care provider. 6. Mammogram- Every year starting at age 58  Take these medicines  Calcium with Vitamin D-Your body needs 1200 mg of Calcium each day and [phone] IU of Vitamin D daily.  Your body can only absorb 500 mg of Calcium at a time so Calcium must be taken in 2 or 3 divided doses throughout the day.  Multivitamin with folic acid- Once daily if it is possible for you to become pregnant.  Get these Immunizations  Gardasil-Series of three doses; prevents HPV related illness such as genital warts and cervical cancer.  Menactra-Single dose; prevents meningitis.  Tetanus shot- Every 10 years.  Flu shot-Every year.  Take these steps 1. Do not smoke-Your healthcare provider can help you quit.  For tips on how to quit go to www.smokefree.gov or call 1-800 QUITNOW. 2. Be physically active- Exercise 5 days a week for at least 30 minutes.  If you are not already physically active, start slow and gradually work up to 30 minutes of moderate physical activity.  Examples of moderate activity include walking briskly, dancing, swimming, bicycling, etc. 3. Breast Cancer- A self breast exam every month is important for early detection of breast cancer.  For more information and instruction on self breast exams, ask your  healthcare provider or [email or web address].es. 4. Eat a healthy diet- Eat a variety of healthy foods such as fruits, vegetables, whole grains, low fat milk, low fat cheeses, yogurt, lean meats, poultry and fish, beans, nuts, tofu, etc.  For more information go to www. Thenutritionsource.org 5. Drink alcohol in moderation- Limit alcohol intake to one drink or less per day. Never drink and drive. 6. Depression- Your emotional health is as important as your physical health.  If you're feeling down or losing interest in things you normally enjoy please talk to your healthcare provider about being screened for depression. 7. Dental visit- Brush and floss your teeth twice daily; visit your dentist twice a year. 8. Eye doctor- Get an eye exam at least every 2 years. 9. Helmet use- Always wear a helmet when riding a bicycle, motorcycle, rollerblading or skateboarding. 10. Safe sex- If you may be exposed to sexually transmitted infections, use a condom. 11. Seat belts- Seat belts can save your live; always wear one. 12. Smoke/Carbon Monoxide detectors- These detectors need to be installed on the appropriate level of your home. Replace batteries at least once a year. 13. Skin cancer- When out in the sun please cover up and use sunscreen 15 SPF or higher. 14. Violence- If anyone is threatening or hurting you, please tell your healthcare provider. Foods Rich in Potassium Food / Potassium (mg)  Apricots, dried,  cup / 378 mg   Apricots, raw, 1 cup halves / 401 mg   Avocado,  / 487 mg   Banana, 1 large / 487  mg   Beef, lean, round, 3 oz / 202 mg   Cantaloupe, 1 cup cubes / 427 mg   Dates, medjool, 5 whole / 835 mg   Ham, cured, 3 oz / 212 mg   Lentils, dried,  cup / 458 mg   Lima beans, frozen,  cup / 258 mg   Orange, 1 large / 333 mg   Orange juice, 1 cup / 443 mg   Peaches, dried,  cup / 398 mg   Peas, split, cooked,  cup / 355 mg   Potato, boiled, 1 medium  / 515 mg   Prunes, dried, uncooked,  cup / 318 mg   Raisins,  cup / 309 mg   Salmon, pink, raw, 3 oz / 275 mg   Sardines, canned , 3 oz / 338 mg   Tomato, raw, 1 medium / 292 mg   Tomato juice, 6 oz / 417 mg   [place], 3 oz / 349 mg  Document Released: 12/31/2004 Document Revised: 09/12/2010 Document Reviewed: 05/16/2008 [facility] Patient Information 2012 [place], [place]. DASH Diet The DASH diet stands for "Dietary Approaches to Stop Hypertension." It is a healthy eating plan that has been shown to reduce high blood pressure (hypertension) in as little as 14 days, while also possibly providing other significant health benefits. These other health benefits include reducing the risk of breast cancer after menopause and reducing the risk of type 2 diabetes, heart disease, colon cancer, and stroke. Health benefits also include weight loss and slowing kidney failure in patients with chronic kidney disease.  DIET GUIDELINES  Limit salt (sodium). Your diet should contain less than 1500 mg of sodium daily.  Limit refined or processed carbohydrates. Your diet should include mostly whole grains. Desserts and added sugars should be used sparingly.  Include small amounts of heart-healthy fats. These types of fats include nuts, oils, and tub margarine. Limit saturated and trans fats. These fats have been shown to be harmful in the body. CHOOSING FOODS  The following food groups are based on a 2000 calorie diet. See your Registered Dietitian for individual calorie needs. Grains and Grain Products (6 to 8 servings daily)  Eat More Often: Whole-wheat bread, brown rice, whole-grain or wheat pasta, quinoa, popcorn without added fat or salt (air popped).  Eat Less Often: White bread, white pasta, white rice, cornbread. Vegetables (4 to 5 servings daily)  Eat More Often: Fresh, frozen, and canned vegetables. Vegetables may be raw, steamed, roasted, or grilled with a minimal amount of fat.  Eat Less  Often/Avoid: Creamed or fried vegetables. Vegetables in a cheese sauce. Fruit (4 to 5 servings daily)  Eat More Often: All fresh, canned (in natural juice), or frozen fruits. Dried fruits without added sugar. One hundred percent fruit juice ( cup [237 mL] daily).  Eat Less Often: Dried fruits with added sugar. Canned fruit in light or heavy syrup. [employer], Fish, and Poultry (2 servings or less daily. One serving is 3 to 4 oz [85-114 g]).  Eat More Often: Ninety percent or leaner ground beef, tenderloin, sirloin. Round cuts of beef, chicken breast, [place] breast. All fish. Grill, bake, or broil your meat. Nothing should be fried.  Eat Less Often/Avoid: Fatty cuts of meat, [place], or chicken leg, thigh, or wing. Fried cuts of meat or fish. Dairy (2 to 3 servings)  Eat More Often: Low-fat or fat-free milk, low-fat plain or light yogurt, reduced-fat or part-skim cheese.  Eat Less Often/Avoid: Milk (whole, 2%).Whole milk yogurt. Full-fat  cheeses. Nuts, Seeds, and Legumes (4 to 5 servings per week)  Eat More Often: All without added salt.  Eat Less Often/Avoid: Salted nuts and seeds, canned beans with added salt. Fats and Sweets (limited)  Eat More Often: Vegetable oils, tub margarines without trans fats, sugar-free gelatin. Mayonnaise and salad dressings.  Eat Less Often/Avoid: Coconut oils, palm oils, butter, stick margarine, cream, half and half, cookies, candy, pie. FOR MORE INFORMATION The Dash Diet Eating Plan: www.dashdiet.org Document Released: 12/20/2010 Document Revised: 03/25/2011 Document Reviewed: 12/20/2010 [facility] Patient Information 2014 [place], [place].

## 2012-10-15 LAB — PAP IG, CT-NG NAA, HPV HIGH-RISK
GC Probe Amp: NEGATIVE
HPV DNA High Risk: NOT DETECTED

## 2012-10-16 ENCOUNTER — Encounter: Payer: Self-pay | Admitting: Family Medicine

## 2012-11-10 ENCOUNTER — Ambulatory Visit: Payer: BC Managed Care – PPO | Admitting: Internal Medicine

## 2012-11-10 VITALS — BP 140/100 | HR 101 | Temp 97.8°F | Resp 16 | Ht 63.0 in | Wt 125.0 lb

## 2012-11-10 DIAGNOSIS — IMO0002 Reserved for concepts with insufficient information to code with codable children: Secondary | ICD-10-CM

## 2012-11-10 DIAGNOSIS — L02412 Cutaneous abscess of left axilla: Secondary | ICD-10-CM

## 2012-11-10 MED ORDER — DOXYCYCLINE HYCLATE 100 MG PO TABS: 100.0000 mg | ORAL_TABLET | Freq: Two times a day (BID) | ORAL | Status: DC

## 2012-11-10 NOTE — Progress Notes (Signed)
Procedure note for left axillary I&D Verbal consent for procedure obtained. Patient prepped and area cleaned. Area numbed with Lidocaine 2%, plain, 2cc. Using a #11 scalpel, a 1 cm incision was made. Pus and sebaceous material was expressed. Cavity was explored with curved hemostats and packed with 1/4 inch packing. Area cleaned and dressed.

## 2012-11-10 NOTE — Progress Notes (Signed)
This chart was scribed for Tonye Pearson, MD by Caryn Bee, Medical Scribe.  Subjective:    Patient ID: Erin Case, female    DOB: [birth year], 37 y.o.   MRN: [id number]  HPI HPI Comments: Erin Case is a 37 y.o. female who presents to [facility] complaining of gradual onset swollen mass under left axillary onset a couple of days ago. Pt states she had a similar mass on her abdomen a few years ago. Pt takes medication for HTN. She also reports that Sunday she had gradual swelling to her lip that resolved when she got home.   Review of Systems Past Medical History  Diagnosis Date   Hypertension    History   Social History   Marital Status: Single    Spouse Name: N/A    Number of Children: N/A   Years of Education: N/A   Occupational History   Not on file.   Social History Main Topics   Smoking status: Current Every Day Smoker   Smokeless tobacco: Not on file   Alcohol Use: Yes   Drug Use: No   Sexual Activity: Not on file   Other Topics Concern   Not on file   Social History Narrative   No narrative on file   History reviewed. No pertinent past surgical history. History reviewed. No pertinent family history. No Known Allergies      Objective:   Physical Exam  Nursing note and vitals reviewed. Constitutional: She is oriented to person, place, and time. She appears well-developed and well-nourished. No distress.  Cardiovascular: Normal rate.   Pulmonary/Chest: Effort normal.  Lymphadenopathy:    She has no cervical adenopathy.  Neurological: She is alert and oriented to person, place, and time.  Skin: She is not diaphoretic.  Abscess under left axillary.  Psychiatric: She has a normal mood and affect. Her behavior is normal.  2cmx3cm w/ erythema/tenderness    Assessment & Plan:  Abscess of axilla, left - Plan: Wound culture  Meds ordered this encounter  Medications   doxycycline (VIBRA-TABS) 100 MG tablet    Sig: Take 1 tablet  (100 mg total) by mouth 2 (two) times daily.    Dispense:  20 tablet    Refill:  0   F/u 48hr   I have completed the patient encounter in its entirety as documented by the scribe, with editing by me where necessary. Robert P. Merla Riches, M.D.

## 2012-11-12 ENCOUNTER — Ambulatory Visit (INDEPENDENT_AMBULATORY_CARE_PROVIDER_SITE_OTHER): Payer: BC Managed Care – PPO | Admitting: Physician Assistant

## 2012-11-12 VITALS — BP 118/60 | HR 94 | Temp 97.7°F | Resp 18 | Ht 64.0 in | Wt 126.4 lb

## 2012-11-12 DIAGNOSIS — L0201 Cutaneous abscess of face: Secondary | ICD-10-CM

## 2012-11-12 DIAGNOSIS — L089 Local infection of the skin and subcutaneous tissue, unspecified: Secondary | ICD-10-CM

## 2012-11-12 LAB — WOUND CULTURE
Gram Stain: NONE SEEN
Gram Stain: NONE SEEN
Organism ID, Bacteria: NO GROWTH

## 2012-11-14 ENCOUNTER — Ambulatory Visit (INDEPENDENT_AMBULATORY_CARE_PROVIDER_SITE_OTHER): Payer: BC Managed Care – PPO | Admitting: Physician Assistant

## 2012-11-14 VITALS — BP 126/80 | HR 91 | Temp 97.7°F | Resp 16 | Ht 63.0 in | Wt 126.8 lb

## 2012-11-14 DIAGNOSIS — L02419 Cutaneous abscess of limb, unspecified: Secondary | ICD-10-CM

## 2012-11-14 DIAGNOSIS — IMO0002 Reserved for concepts with insufficient information to code with codable children: Secondary | ICD-10-CM

## 2012-11-14 NOTE — Progress Notes (Signed)
Patient ID: KYNZLI REASE MRN: [id number], DOB: 04-05-1975 37 y.o. Date of Encounter: 11/14/2012, 4:58 PM  Chief Complaint: Wound care   See previous note  HPI: 37 y.o. y/o female presents for wound care s/p I&D on 11/10/12. Doing well No issues or complaints Afebrile/ no chills No nausea or vomiting Tolerating doxycycline.  Pain resolved.  Daily dressing change Previous note reviewed  Past Medical History  Diagnosis Date  . Hypertension      Home Meds: Prior to Admission medications   Medication Sig Start Date End Date Taking? Authorizing Provider  doxycycline (VIBRA-TABS) 100 MG tablet Take 1 tablet (100 mg total) by mouth 2 (two) times daily. 11/10/12  Yes Tonye Pearson, MD  lisinopril-hydrochlorothiazide (ZESTORETIC) 20-12.5 MG per tablet Take 1 tablet by mouth daily. 10/13/12  Yes Sherren Mocha, MD    Allergies: No Known Allergies  ROS: Constitutional: Afebrile, no chills Cardiovascular: negative for chest pain or palpitations Dermatological: Positive for wound. Negative for erythema, pain, or warmth.  GI: No nausea or vomiting   EXAM: Physical Exam: Blood pressure 126/80, pulse 91, temperature 97.7 F (36.5 C), temperature source Oral, resp. rate 16, height 5\' 3"  (1.6 m), weight 126 lb 12.8 oz (57.516 kg), SpO2 100.00%., Body mass index is 22.47 kg/(m^2). General: Well developed, well nourished, in no acute distress. Nontoxic appearing. Head: Normocephalic, atraumatic, sclera non-icteric.  Neck: Supple. Lungs: Breathing is unlabored. Heart: Normal rate. Skin:  Warm and moist. Dressing and packing in place. No induration, erythema, or tenderness to palpation. Neuro: Alert and oriented X 3. Moves all extremities spontaneously. Normal gait.  Psych:  Responds to questions appropriately with a normal affect.       PROCEDURE: Dressing and packing removed. No purulence expressed Wound bed healthy Irrigated with 1% plain lidocaine 5 cc. Repacked with  1/4" plain packing.  Dressing applied  LAB: Culture: no growth  A/P: 37 y.o. y/o female with axillary cellulitis/abscess as above s/p I&D on 11/10/12.  Wound care per above Continue doxycycline.  Pain well controlled Daily dressing changes Recheck 48 hours  Signed, Rhoderick Moody, PA-C 11/14/2012 4:58 PM

## 2012-11-16 ENCOUNTER — Ambulatory Visit (INDEPENDENT_AMBULATORY_CARE_PROVIDER_SITE_OTHER): Payer: BC Managed Care – PPO | Admitting: Physician Assistant

## 2012-11-16 VITALS — BP 100/60 | HR 95 | Temp 97.9°F | Resp 16 | Ht 63.0 in | Wt 126.0 lb

## 2012-11-16 DIAGNOSIS — IMO0002 Reserved for concepts with insufficient information to code with codable children: Secondary | ICD-10-CM

## 2012-11-16 DIAGNOSIS — L02412 Cutaneous abscess of left axilla: Secondary | ICD-10-CM

## 2012-11-16 NOTE — Patient Instructions (Signed)
Continue to take your Doxycline medication until finished Continue using warm compresses to the area Return if area becomes more swollen, red, or tender or you develop a fever

## 2012-11-16 NOTE — Progress Notes (Signed)
  Subjective:    Patient ID: Erin Case, female    DOB: 03-Dec-1975, 37 y.o.   MRN: [id number]  Wound Check   [name] is a 37 YO female who presents for follow-up of left axilla abscess.  The abscess was incised and drained on 11/10/12, packed with 1/4 inch packing, and placed on 100 mg Doxycyline BID for 10 days.  She was seen again and re-evaluated on 11/12/12 and 11/14/12.  No purulence was expressed on her most recent visit on 11/14/12 and the patient has noticed her swelling and pain has decreased significantly in her left axilla. She denies any fever, chills, or drainage from her wound site.   Past Medical History  Diagnosis Date  . Hypertension     Current Outpatient Prescriptions on File Prior to Visit  Medication Sig Dispense Refill  . doxycycline (VIBRA-TABS) 100 MG tablet Take 1 tablet (100 mg total) by mouth 2 (two) times daily.  20 tablet  0  . lisinopril-hydrochlorothiazide (ZESTORETIC) 20-12.5 MG per tablet Take 1 tablet by mouth daily.  90 tablet  0   No current facility-administered medications on file prior to visit.     Review of Systems  Constitutional: Negative for fever, chills, activity change and fatigue.  Skin: Negative for color change, pallor and rash.       Objective:   Physical Exam General: [name] is a pleasant, well-developed, well-nourished African American female in no acute distress Skin:  Dressing and packing in place in left axilla.  No tenderness to palpation.  Packing removed with no purulence expressed.  Wound bed shallow and beefy red, scant induration of lower border of wound consistent with healing wound.    BP 100/60  Pulse 95  Temp(Src) 97.9 F (36.6 C) (Oral)  Resp 16  Ht 5\' 3"  (1.6 m)  Wt 126 lb (57.153 kg)  BMI 22.33 kg/m2  SpO2 99%    Assessment & Plan:  Abscess of axilla, left -continue current plan: Finish Doxycline 100 mg/BID, follow up as needed for any increased swelling, redness, tenderness to the  area  Patient Instructions  Continue to take your Doxycline medication until finished Continue using warm compresses to the area Return if area becomes more swollen, red, or tender or you develop a fever

## 2012-11-16 NOTE — Progress Notes (Signed)
I have examined this patient along with the student and agree.  

## 2012-11-16 NOTE — Progress Notes (Signed)
   [address], [place] [address]   Phone [phone]  Subjective:    Patient ID: Erin Case, female    DOB: [birth year], 37 y.o.   MRN: [id number]  HPI Pt presents to clinic for wound recheck.  She has not changed her drsg.  The pain is much better.  She is taking the Doxy without SE.  Review of Systems     Objective:   Physical Exam  Vitals reviewed. Constitutional: She is oriented to person, place, and time. She appears well-developed and well-nourished.  HENT:  Head: Normocephalic and atraumatic.  Right Ear: External ear normal.  Left Ear: External ear normal.  Pulmonary/Chest: Effort normal.  Neurological: She is alert and oriented to person, place, and time.  Skin: Skin is warm and dry.  Drsg removed from L axilla.  No purulent drainage from the wound.  Some sebaceous material expresses from the medial superior aspect of the wound - irrigated with 2% lido and then repacked with 1/4in plain packing. Drsg placed.  Psychiatric: She has a normal mood and affect. Her behavior is normal. Judgment and thought content normal.       Assessment & Plan:  Infected sebaceous cyst - appears to be improved - without surrounding erythema.  Pt to change the drsg daily and recheck in 2-3 days.

## 2012-12-14 ENCOUNTER — Ambulatory Visit: Payer: Self-pay | Admitting: Internal Medicine

## 2013-11-12 ENCOUNTER — Emergency Department (HOSPITAL_COMMUNITY)
Admission: EM | Admit: 2013-11-12 | Discharge: 2013-11-12 | Disposition: A | Discharge status: Home / Self Care | Payer: Self-pay | Attending: Emergency Medicine | Admitting: Emergency Medicine

## 2013-11-12 ENCOUNTER — Encounter (HOSPITAL_COMMUNITY): Payer: Self-pay | Admitting: Emergency Medicine

## 2013-11-12 DIAGNOSIS — Z720 Tobacco use: Secondary | ICD-10-CM | POA: Insufficient documentation

## 2013-11-12 DIAGNOSIS — B9689 Other specified bacterial agents as the cause of diseases classified elsewhere: Secondary | ICD-10-CM

## 2013-11-12 DIAGNOSIS — R102 Pelvic and perineal pain: Secondary | ICD-10-CM

## 2013-11-12 DIAGNOSIS — N939 Abnormal uterine and vaginal bleeding, unspecified: Secondary | ICD-10-CM

## 2013-11-12 DIAGNOSIS — Z3202 Encounter for pregnancy test, result negative: Secondary | ICD-10-CM | POA: Insufficient documentation

## 2013-11-12 DIAGNOSIS — I10 Essential (primary) hypertension: Secondary | ICD-10-CM | POA: Insufficient documentation

## 2013-11-12 DIAGNOSIS — N760 Acute vaginitis: Secondary | ICD-10-CM | POA: Insufficient documentation

## 2013-11-12 LAB — WET PREP, GENITAL
Trich, Wet Prep: NONE SEEN
Yeast Wet Prep HPF POC: NONE SEEN

## 2013-11-12 LAB — URINALYSIS, ROUTINE W REFLEX MICROSCOPIC
Bilirubin Urine: NEGATIVE
GLUCOSE, UA: NEGATIVE mg/dL
KETONES UR: NEGATIVE mg/dL
LEUKOCYTES UA: NEGATIVE
Nitrite: NEGATIVE
Protein, ur: NEGATIVE mg/dL
Specific Gravity, Urine: 1.024 (ref 1.005–1.030)
Urobilinogen, UA: 0.2 mg/dL (ref 0.0–1.0)
pH: 6 (ref 5.0–8.0)

## 2013-11-12 LAB — URINE MICROSCOPIC-ADD ON

## 2013-11-12 LAB — POC URINE PREG, ED: Preg Test, Ur: NEGATIVE

## 2013-11-12 MED ORDER — METRONIDAZOLE 500 MG PO TABS: 500.0000 mg | ORAL_TABLET | Freq: Two times a day (BID) | ORAL | Status: DC

## 2013-11-12 MED ORDER — LISINOPRIL-HYDROCHLOROTHIAZIDE 20-12.5 MG PO TABS: 1.0000 | ORAL_TABLET | Freq: Every day | ORAL | Status: DC

## 2013-11-12 NOTE — ED Notes (Signed)
Pt has had intermittent vaginal bleeding, pinkish red color, with some abd cramping and feels like she is going to have another menstrual cycle and she had on that started on 10/28/13.  Pt states that she does have vaginal bleeding after intercourse.  Pt also notices some odor with the vaginal bleeding.  Not enough bleeding for pad but pt has enough that she has to wear a panty liner pad.

## 2013-11-12 NOTE — ED Notes (Signed)
MD at bedside. 

## 2013-11-12 NOTE — ED Provider Notes (Signed)
CSN: [id number]     Arrival date & time 11/12/13  1551 History   First MD Initiated Contact with Patient 11/12/13 1618     Chief Complaint  Patient presents with  . Vaginal Bleeding  . Abdominal Cramping     (Consider location/radiation/quality/duration/timing/severity/associated sxs/prior Treatment) Patient is a 38 y.o. female presenting with vaginal bleeding and cramps. The history is provided by the patient.  Vaginal Bleeding Quality:  Spotting Severity:  Mild Onset quality:  Gradual Duration:  10 days Timing:  Intermittent Progression:  Waxing and waning Chronicity:  New Menstrual history:  Irregular Possible pregnancy: no (hx of tubal ligation)   Context: after intercourse and spontaneously   Relieved by:  Nothing Worsened by:  Nothing tried Ineffective treatments:  None tried Associated symptoms: vaginal discharge   Associated symptoms: no abdominal pain, no back pain, no dizziness, no dysuria, no fatigue, no fever and no nausea   Abdominal Cramping Associated symptoms: vaginal bleeding and vaginal discharge   Associated symptoms: no chest pain, no cough, no diarrhea, no dysuria, no fatigue, no fever, no hematuria, no nausea, no shortness of breath and no vomiting     Past Medical History  Diagnosis Date  . Hypertension    History reviewed. No pertinent past surgical history. No family history on file. History  Substance Use Topics  . Smoking status: Current Every Day Smoker  . Smokeless tobacco: Not on file  . Alcohol Use: Yes   OB History   Grav Para Term Preterm Abortions TAB SAB Ect Mult Living                 Review of Systems  Constitutional: Negative for fever and fatigue.  HENT: Negative for congestion and drooling.   Eyes: Negative for pain.  Respiratory: Negative for cough and shortness of breath.   Cardiovascular: Negative for chest pain.  Gastrointestinal: Negative for nausea, vomiting, abdominal pain and diarrhea.  Genitourinary: Positive  for vaginal bleeding, vaginal discharge and pelvic pain. Negative for dysuria and hematuria.  Musculoskeletal: Negative for back pain, gait problem and neck pain.  Skin: Negative for color change.  Neurological: Negative for dizziness and headaches.  Hematological: Negative for adenopathy.  Psychiatric/Behavioral: Negative for behavioral problems.  All other systems reviewed and are negative.     Allergies  Review of patient's allergies indicates no known allergies.  Home Medications   Prior to Admission medications   Medication Sig Start Date End Date Taking? Authorizing Provider  Aspirin-Salicylamide-Caffeine (BC HEADACHE POWDER PO) Take 1 packet by mouth daily as needed (headache).   Yes Historical Provider, MD   BP 198/116  Pulse 90  Temp(Src) 97.5 F (36.4 C) (Oral)  Resp 18  SpO2 100% Physical Exam  Nursing note and vitals reviewed. Constitutional: She is oriented to person, place, and time. She appears well-developed and well-nourished.  HENT:  Head: Normocephalic and atraumatic.  Mouth/Throat: Oropharynx is clear and moist. No oropharyngeal exudate.  Eyes: Conjunctivae and EOM are normal. Pupils are equal, round, and reactive to light.  Neck: Normal range of motion. Neck supple.  Cardiovascular: Normal rate, regular rhythm, normal heart sounds and intact distal pulses.  Exam reveals no gallop and no friction rub.   No murmur heard. Pulmonary/Chest: Effort normal and breath sounds normal. No respiratory distress. She has no wheezes.  Abdominal: Soft. Bowel sounds are normal. There is no tenderness. There is no rebound and no guarding.  Genitourinary:  Normal-appearing external vagina. Normal-appearing cervix. Os closed. Mild amount of dark red  blood in the posterior fornix.  No cervical motion tenderness or adnexal tenderness during bimanual exam.  Musculoskeletal: Normal range of motion. She exhibits no edema and no tenderness.  No CVA tenderness noted.   Neurological: She is alert and oriented to person, place, and time.  Skin: Skin is warm and dry.  Psychiatric: She has a normal mood and affect. Her behavior is normal.    ED Course  Procedures (including critical care time) Labs Review Labs Reviewed  WET PREP, GENITAL - Abnormal; Notable for the following:    Clue Cells Wet Prep HPF POC FEW (*)    WBC, Wet Prep HPF POC RARE (*)    All other components within normal limits  URINALYSIS, ROUTINE W REFLEX MICROSCOPIC - Abnormal; Notable for the following:    APPearance CLOUDY (*)    Hgb urine dipstick LARGE (*)    All other components within normal limits  GC/CHLAMYDIA PROBE AMP  URINE MICROSCOPIC-ADD ON  POC URINE PREG, ED    Imaging Review No results found.   EKG Interpretation None      MDM   Final diagnoses:  BV (bacterial vaginosis)  Vaginal bleeding  Pelvic cramping    4:41 PM 38 y.o. female with a history of irregular periods, herpes who presents with vaginal spotting and pelvic cramping over the last 10 days. She states that she had her period between the 15th 21st of this month. She has had intermittent vaginal spotting since that time, worse after sex. She also notes some intermittent pelvic cramping and some mild left lower back pain. She denies any dysuria. She is afebrile and hypertensive here. She was prescribed blood pressure medicine in the past but has not been on it and needs a new PCP. Will perform pelvic exam. She has no pain currently on exam.  6:02 PM: Few clue cells noted, will treat for BV. We'll recommend ibuprofen as needed for the pelvic cramping. Will write her prescription for the blood pressure medicine that she was previously taking. She wishes to follow-up with a primary care provider and I will give her resources to do so. I have discussed the diagnosis/risks/treatment options with the patient and believe the pt to be eligible for discharge home to follow-up with a pcp. We also discussed  returning to the ED immediately if new or worsening sx occur. We discussed the sx which are most concerning (e.g., worsening vaginal bleeding, worsening pain, fever, sob, dizziness) that necessitate immediate return. Medications administered to the patient during their visit and any new prescriptions provided to the patient are listed below.  Medications given during this visit Medications - No data to display  New Prescriptions   LISINOPRIL-HYDROCHLOROTHIAZIDE (PRINZIDE) 20-12.5 MG PER TABLET    Take 1 tablet by mouth daily.   METRONIDAZOLE (FLAGYL) 500 MG TABLET    Take 1 tablet (500 mg total) by mouth 2 (two) times daily. One po bid x 7 days     Purvis SheffieldForrest Goerge Mohr, MD 11/12/13 [phone]

## 2013-11-12 NOTE — ED Notes (Signed)
Pt states that she doesn't have a regular PCP and hasnt been taking her BP meds in several months.

## 2013-11-12 NOTE — Discharge Instructions (Signed)

## 2013-11-13 LAB — GC/CHLAMYDIA PROBE AMP
CT Probe RNA: NEGATIVE
GC PROBE AMP APTIMA: NEGATIVE

## 2014-03-15 ENCOUNTER — Emergency Department (HOSPITAL_COMMUNITY)
Admission: EM | Admit: 2014-03-15 | Discharge: 2014-03-15 | Disposition: A | Discharge status: Home / Self Care | Payer: Self-pay | Attending: Emergency Medicine | Admitting: Emergency Medicine

## 2014-03-15 ENCOUNTER — Encounter (HOSPITAL_COMMUNITY): Payer: Self-pay | Admitting: *Deleted

## 2014-03-15 ENCOUNTER — Emergency Department (HOSPITAL_COMMUNITY): Payer: Self-pay

## 2014-03-15 DIAGNOSIS — J069 Acute upper respiratory infection, unspecified: Secondary | ICD-10-CM | POA: Insufficient documentation

## 2014-03-15 DIAGNOSIS — Z720 Tobacco use: Secondary | ICD-10-CM | POA: Insufficient documentation

## 2014-03-15 DIAGNOSIS — Z79899 Other long term (current) drug therapy: Secondary | ICD-10-CM | POA: Insufficient documentation

## 2014-03-15 DIAGNOSIS — I10 Essential (primary) hypertension: Secondary | ICD-10-CM | POA: Insufficient documentation

## 2014-03-15 DIAGNOSIS — Z792 Long term (current) use of antibiotics: Secondary | ICD-10-CM | POA: Insufficient documentation

## 2014-03-15 MED ORDER — LISINOPRIL-HYDROCHLOROTHIAZIDE 20-12.5 MG PO TABS: 1.0000 | ORAL_TABLET | Freq: Every day | ORAL | Status: DC

## 2014-03-15 MED ORDER — ALBUTEROL SULFATE HFA 108 (90 BASE) MCG/ACT IN AERS: 2.0000 | INHALATION_SPRAY | RESPIRATORY_TRACT | Status: AC | PRN

## 2014-03-15 MED ORDER — PHENYLEPHRINE-DM-GG-APAP 5-10-200-325 MG PO CAPS: 2.0000 | ORAL_CAPSULE | Freq: Three times a day (TID) | ORAL | Status: DC

## 2014-03-15 MED ORDER — ACETAMINOPHEN 325 MG PO TABS
650.0000 mg | ORAL_TABLET | Freq: Once | ORAL | Status: AC
  Administered 2014-03-15: 650 mg via ORAL
  Filled 2014-03-15: qty 2

## 2014-03-15 NOTE — ED Notes (Signed)
Per patient, she is experiencing a dry cough and hard to breath. Symptoms started on Thursday. Denies chills or fever. Took a BC earlier today.

## 2014-03-15 NOTE — ED Provider Notes (Signed)
CSN: [id number]     Arrival date & time 03/15/14  1908 History  This chart was scribed for non-physician practitioner, Harle Battiest, NP working with Tilden Fossa, MD by Gwenyth Ober, ED scribe. This patient was seen in room WTR8/WTR8 and the patient's care was started at 8:06 PM   Chief Complaint  Patient presents with  . Cough  . Sore Throat   The history is provided by the patient. No language interpreter was used.    HPI Comments: Erin Case is a 39 y.o. female with a history of HTN who presents to the Emergency Department complaining of constant, moderate sore throat that started 2 days ago. She states productive cough with yellow sputum and intermittent SOB as associated symptoms. She tried Tylenol PTA with no relief. Pt denies fever, chills, nausea and vomiting as associated symptoms.  Pt also requests prescription refill of Lisinopril-HCTZ. She states she ran out of medication 1 month ago. Her BP during evaluation was 159/112 with O2 sat of 98%.  No PCP  Past Medical History  Diagnosis Date  . Hypertension    Past Surgical History  Procedure Laterality Date  . Tubal ligation     History reviewed. No pertinent family history. History  Substance Use Topics  . Smoking status: Current Every Day Smoker    Types: Cigarettes  . Smokeless tobacco: Not on file  . Alcohol Use: Yes     Comment: Usually drinks "once a while"   OB History    No data available     Review of Systems  Constitutional: Negative for fever and chills.  HENT: Positive for sore throat.   Respiratory: Positive for cough and shortness of breath.   Gastrointestinal: Negative for nausea and vomiting.   Allergies  Review of patient's allergies indicates no known allergies.  Home Medications   Prior to Admission medications   Medication Sig Start Date End Date Taking? Authorizing Provider  Aspirin-Salicylamide-Caffeine (BC HEADACHE POWDER PO) Take 1 packet by mouth daily as needed  (headache).    Historical Provider, MD  lisinopril-hydrochlorothiazide (PRINZIDE) 20-12.5 MG per tablet Take 1 tablet by mouth daily. 11/12/13   Purvis Sheffield, MD  metroNIDAZOLE (FLAGYL) 500 MG tablet Take 1 tablet (500 mg total) by mouth 2 (two) times daily. One po bid x 7 days 11/12/13   Purvis Sheffield, MD   BP 164/119 mmHg  Pulse 127  Temp(Src) 98.2 F (36.8 C) (Oral)  Resp 20  Ht  (1.575 m)  Wt 120 lb (54.432 kg)  BMI 21.94 kg/m2  SpO2 98%  LMP 03/03/2014 Physical Exam  Constitutional: She is oriented to person, place, and time. She appears well-developed and well-nourished. No distress.  HENT:  Head: Normocephalic and atraumatic.  Mouth/Throat: No oropharyngeal exudate.  Frontal and maxillary tenderness to palpation; oropharynx mildly erythematous, but no exudate  Eyes: Pupils are equal, round, and reactive to light.  Neck: Neck supple.  No lymphadenopathy noted  Cardiovascular: Normal rate.   Pulmonary/Chest: Effort normal and breath sounds normal. No respiratory distress.  Lungs clear in all fields to ausculation  Musculoskeletal: She exhibits no edema.  Lymphadenopathy:    She has no cervical adenopathy.  Neurological: She is alert and oriented to person, place, and time. No cranial nerve deficit.  Skin: Skin is warm and dry. No rash noted.  Psychiatric: She has a normal mood and affect. Her behavior is normal.  Nursing note and vitals reviewed.   ED Course  Procedures  DIAGNOSTIC STUDIES: Oxygen Saturation  is 98% on RA, normal by my interpretation.    COORDINATION OF CARE: 8:13 PM Discussed treatment plan with pt which includes decongestant, albuterol and Lisinopril-HCTZ and pt agreed to plan.  Labs Review Labs Reviewed - No data to display  Imaging Review Dg Chest 2 View  03/15/2014   CLINICAL DATA:  Initial evaluation for acute cough, sore throat.  EXAM: CHEST  2 VIEW  COMPARISON:  Prior study from 10/16/2008  FINDINGS: The cardiac and mediastinal  silhouettes are stable in size and contour, and remain within normal limits.  The lungs are normally inflated. No airspace consolidation, pleural effusion, or pulmonary edema is identified. There is no pneumothorax.  No acute osseous abnormality identified.  IMPRESSION: No active cardiopulmonary disease.   Electronically Signed   By: Rise MuBenjamin  McClintock M.D.   On: 03/15/2014 20:28     EKG Interpretation None      MDM   Final diagnoses:  URI (upper respiratory infection)   39 yo with symptoms consistent with URI. Her CXR is negative for acute infiltrate. Discussed that antibiotics are not indicated for viral infections. Heart rate improved after tylenol and oral hydration. He blood pressure is elevated due to non-adherence with bp meds for over a month. Pt denies any chest pain or shortness of breath.  Pt will be discharged with symptomatic treatment.  Pt is well-appearing, in no acute distress and vital signs reviewed and not concerning. She appears safe to be discharged.  Discharge include resources to establish care with a PCP.  Return precautions provided.  Verbalizes understanding and is agreeable with plan.  Prescription for bp meds provided.   I personally performed the services described in this documentation, which was scribed in my presence. The recorded information has been reviewed and is accurate.  Filed Vitals:   03/15/14 1928 03/15/14 2103  BP: 164/119 158/112  Pulse: 127 99  Temp: 98.2 F (36.8 C) 97.9 F (36.6 C)  TempSrc: Oral Oral  Resp: 20 20  Height: 5\' 2"  (1.575 m)   Weight: 120 lb (54.432 kg)   SpO2: 98% 100%   Meds given in ED:  Medications  acetaminophen (TYLENOL) tablet 650 mg (650 mg Oral Given 03/15/14 2040)    Discharge Medication List as of 03/15/2014  9:18 PM    START taking these medications   Details  albuterol (PROVENTIL HFA;VENTOLIN HFA) 108 (90 BASE) MCG/ACT inhaler Inhale 2 puffs into the lungs every 4 (four) hours as needed for wheezing or  shortness of breath., Starting 03/15/2014, Until Discontinued, Print    Phenylephrine-DM-GG-APAP (MUCINEX FAST-MAX) 5-10-200-325 MG CAPS Take 2 capsules by mouth 3 (three) times daily., Starting 03/15/2014, Until Discontinued, Print          Harle BattiestElizabeth Tj Kitchings, NP 03/16/14 [phone]  Tilden FossaElizabeth Rees, MD 03/18/14 [phone]

## 2014-03-15 NOTE — ED Notes (Signed)
Pt states she has had productive cough and lethargy for the past 3-4 days. Pt denies fever. Pt denies having asthma, states she does smoke.

## 2014-03-15 NOTE — Discharge Instructions (Signed)
Please follow the directions provided. Use the resource guide to establish care with a primary care doctor to ensure you're getting better and to manage her high blood pressure. Please use the albuterol inhaler 2 puffs every 4 hours as needed for cough or shortness of breath. Use the multi symptom cold medicine 3 times a day to help with your symptoms. Please take your blood pressure medicine as directed. Don't hesitate to return for any new, worsening, or concerning symptoms.   SEEK IMMEDIATE MEDICAL CARE IF:  You develop an increased fever or chills.  You have chest pain.  You have severe shortness of breath.  You have bloody sputum.  You develop dehydration.  You faint or repeatedly feel like you are going to pass out.  You develop repeated vomiting.  You develop a severe headache.    Emergency Department Resource Guide 1) Find a Doctor and Pay Out of Pocket Although you won't have to find out who is covered by your insurance plan, it is a good idea to ask around and get recommendations. You will then need to call the office and see if the doctor you have chosen will accept you as a new patient and what types of options they offer for patients who are self-pay. Some doctors offer discounts or will set up payment plans for their patients who do not have insurance, but you will need to ask so you aren't surprised when you get to your appointment.  2) Contact Your Local Health Department Not all health departments have doctors that can see patients for sick visits, but many do, so it is worth a call to see if yours does. If you don't know where your local health department is, you can check in your phone book. The CDC also has a tool to help you locate your state's health department, and many state websites also have listings of all of their local health departments.  3) Find a Walk-in Clinic If your illness is not likely to be very severe or complicated, you may want to try a walk in clinic.  These are popping up all over the country in pharmacies, drugstores, and shopping centers. They're usually staffed by nurse practitioners or physician assistants that have been trained to treat common illnesses and complaints. They're usually fairly quick and inexpensive. However, if you have serious medical issues or chronic medical problems, these are probably not your best option.  No Primary Care Doctor: - Call Health Connect at  [phone] - they can help you locate a primary care doctor that  accepts your insurance, provides certain services, etc. - Physician Referral Service- [phone]  Chronic Pain Problems: Organization         Address  Phone   Notes  [facility]  [phone] Patients need to be referred by their primary care doctor.   Medication Assistance: Organization         Address  Phone   Notes  [facility] Medication [facility] [address] [place], [place] [phone] --Must be a resident of [facility] [place] -- Must have NO insurance coverage whatsoever (no Medicaid/ Medicare, etc.) -- The pt. MUST have a primary care doctor that directs their care regularly and follows them in the community   MedAssist  [phone]   [employer]  [phone]    Agencies that provide inexpensive medical care: Organization         Address  Phone   Notes  [facility]  [phone]   [facility]    [phone]   [facility] [address] [place], [place] [address] [phone]   [facility] [address] [place], [place] [phone]   Planned Parenthood    [phone]   [facility]    [phone]   [facility]  [address], [place] Phone:  [phone], Fax:  [phone] Hours of Operation:  9 am - 6 pm, M-F.  Also accepts Medicaid/Medicare and self-pay.  [facility] for  Children  [address], [place] Phone: [phone], Fax: [phone]. Hours of Operation:  8:30 am - 5:30 pm, M-F.  Also accepts Medicaid and self-pay.  [facility] [place] [address], [place] Phone: [phone]   [facility] [address] Natasha Bence [place], [place] [phone], Ext. [phone] Mondays & Thursdays: 7-9 AM.  First 15 patients are seen on a first come, first serve basis.    Medicaid-accepting [facility] Providers:  Organization         Address  Phone   Notes  [facility] [address], [place] [phone] Also accepts self-pay patients.  [facility] [address] Laurell Josephs [place], [place]  [phone]   [facility] [address], [place] [phone]   [facility] [address] [place] [phone]   Renaye Rakers [address], [place]   [phone] Only accepts [place] Access [place] patients after they have their name applied to their card.   Self-Pay (no insurance) in [facility]:  Organization         Address  Phone   Notes  Sickle Cell Patients, [facility] [address] [place], [place] [phone]   [facility] [address] [place], [place] [phone]   [facility] [place]  [address], [place] [phone]   [facility]/Dr. Osei-Bonsu  [address], [place] or [address], [place] [phone] Phone number for both [place] and [place] locations is the same.  [facility] [address] [place] [phone]   [facility] [address], [place] or [address] [phone]   [facility] [address] [place] [phone], phone; [phone], fax Sees patients 1st  and 3rd Saturday of every month.  Must not qualify for public or private insurance (i.e. Medicaid, Medicare, [place] Health Choice, Veterans' Benefits)  Household income should be no more than 200% of the poverty level The clinic cannot treat you if you are pregnant or think you are pregnant  Sexually transmitted diseases are not treated at the clinic.    Dental Care: Organization         Address  Phone  Notes  [facility] Department of [facility] [address] [place], [place] [phone] Accepts children up to age 77 who are enrolled in [place] or [place] Health Choice; pregnant women with a Medicaid card; and children who have applied for Medicaid or  Health Choice, but were declined, whose parents can pay a reduced fee at time of service.  [facility] Department of  [facility]  [address], [place] [phone] Accepts children up to age 11 who are enrolled in Medicaid or [place] Health Choice; pregnant women with a Medicaid card; and children who have applied for Medicaid or [place] Health Choice, but were declined, whose parents can pay a reduced fee at time of service.  [facility]  [address] [place], [place] [phone] Patients are seen by appointment only. Walk-ins are not accepted. [facility] will see patients 73 years of age and older. Monday - Tuesday (8am-5pm) Most Wednesdays (8:30-5pm) $30 per visit, cash only  [facility] Adult Dental Access PROGRAM  [address], [facility] [phone] Patients are seen by appointment only. Walk-ins are not accepted. [facility] will see patients 28 years of age and older. One Wednesday Evening (Monthly: Volunteer Based).  $30 per visit, cash only  [employer]  [phone] for adults; Children under age 2, call Graduate Pediatric Dentistry at [phone]. Children aged 4-14, please call [phone] to request a pediatric  application.  Dental services are provided in all areas of dental care including fillings, crowns and bridges, complete and partial dentures, implants, gum treatment, root canals, and extractions. Preventive care is also provided. Treatment is provided to both adults and children. Patients are selected via a lottery and there is often a waiting list.   [facility] [address], [place]  [phone] [email or web address]   [facility] [address] [place], [place] [phone], Ext. [phone] Second and Fourth Thursday of each month, opens at 6:30 AM; Clinic ends at 9 AM.  Patients are seen on a first-come first-served basis, and a limited number are seen during each clinic.   [facility]  [address] Ether Griffins [place], [place] [phone]   Eligibility Requirements You must have lived in [place], [place], or [place] counties for at least the last three months.   You cannot be eligible for state or federal sponsored [place], including [employer], [place], or [employer].   You generally cannot be eligible for healthcare insurance through your employer.    How to apply: Eligibility screenings are held every Tuesday and Wednesday afternoon from 1:00 pm until 4:00 pm. You do not need an appointment for the interview!  [facility] [address], [place], [place] [phone]   [facility] Health Department  [phone]   [facility] Health Department  [phone]   [facility] Health Department  [phone]    Behavioral Health Resources in the Community: Intensive Outpatient Programs Organization         Address  Phone  Notes  [facility] [address], [place], [place] [phone]   [facility]patient [address] [place], [place] [phone]   [facility] [address], [place], [place]  [phone]   [facility]  [address]  [place], [place] [phone] or [phone]   Substance Abuse Resources Organization         Address  Phone  Notes  Al[facility] Drug Services  [phone]   [facility]  [phone]   [facility] [place]  [phone]   Floydene Flock  [phone]   Residential & Outpatient Substance Abuse Program  [phone]   Psychological Services Organization         Address  Phone  Notes  [facility] Behavioral Health  [place]   [place] [address] [place] or [phone]    Mobile Crisis Teams Organization         Address  Phone  Notes  [facility], Mobile Crisis Care Unit  [phone]   [facility]  [address] [place], [place]   [place] Levels [address], [place] [phone]    Self-Help/Support Groups Organization         Address  Phone             Notes  [place]. of [place] - variety of support groups  [place] Call for more information  Narcotics Anonymous (NA), [facility] [address], [employer] [place]  2 meetings at this location   Special educational needs teacher         Address  Phone  Notes  [facility] [place],    [place]  [phone]   [facility]  [address] [place] [address], [place], [place]   [place], [place] [phone] Admissions: 8am-3pm M-F  Incentives Substance [place] [address],    [place], [place] [phone]   [facility] [address] [place], [place], [place]   [facility] [address],  [place], [place]   [facility] - Intensive Outpatient [address] Kristeen Mans [address], [place], [place]   [facility] ([place].,    [place], [place] [phone] or [phone]   [facility] ([facility]) [address] [place], [place] Accepts Medicaid  [facility] [place] [facility] [address]  [place] [phone] Substance Abuse/Addiction Treatment   [facility] Organization         Address  Phone  Notes  [facility]  [phone]   Domenic Schwab, PhD [address] Arlis Porta [place], [place]   [phone] or [phone]   [place], [place] [phone]   [facility] [address] [place], [place] [phone] Insurance/Medicaid/sponsorship through [facility] and Families [address], [address] [place]                                    [place], [place] [phone] [place] [address], [place] [phone]    Dr. Adele Schilder  [phone]   [facility] Dept. 1) [address], [place] 2) [place] 3)  [address], [place] [phone]  [phone]   [place] [phone] or [phone] (After Hours)

## 2014-03-15 NOTE — ED Notes (Signed)
Patient has tenderness to her shoulders, upper back, and mid back with tenderness. Denies any numbness or tingling of hands, fingers.

## 2014-03-15 NOTE — ED Notes (Signed)
Informed to drink two cups of ice water. Provided two cups of ice water.

## 2014-09-18 ENCOUNTER — Encounter (HOSPITAL_COMMUNITY): Payer: Self-pay

## 2014-09-18 ENCOUNTER — Emergency Department (HOSPITAL_COMMUNITY)
Admission: EM | Admit: 2014-09-18 | Discharge: 2014-09-18 | Disposition: A | Discharge status: Home / Self Care | Payer: Self-pay | Attending: Emergency Medicine | Admitting: Emergency Medicine

## 2014-09-18 DIAGNOSIS — T7840XA Allergy, unspecified, initial encounter: Secondary | ICD-10-CM | POA: Insufficient documentation

## 2014-09-18 DIAGNOSIS — Z79899 Other long term (current) drug therapy: Secondary | ICD-10-CM | POA: Insufficient documentation

## 2014-09-18 DIAGNOSIS — X58XXXA Exposure to other specified factors, initial encounter: Secondary | ICD-10-CM | POA: Insufficient documentation

## 2014-09-18 DIAGNOSIS — Y998 Other external cause status: Secondary | ICD-10-CM | POA: Insufficient documentation

## 2014-09-18 DIAGNOSIS — Y9389 Activity, other specified: Secondary | ICD-10-CM | POA: Insufficient documentation

## 2014-09-18 DIAGNOSIS — Z720 Tobacco use: Secondary | ICD-10-CM | POA: Insufficient documentation

## 2014-09-18 DIAGNOSIS — Y9289 Other specified places as the place of occurrence of the external cause: Secondary | ICD-10-CM | POA: Insufficient documentation

## 2014-09-18 DIAGNOSIS — I10 Essential (primary) hypertension: Secondary | ICD-10-CM | POA: Insufficient documentation

## 2014-09-18 MED ORDER — PREDNISONE 10 MG PO TABS: 20.0000 mg | ORAL_TABLET | Freq: Every day | ORAL | Status: DC

## 2014-09-18 MED ORDER — METHYLPREDNISOLONE SODIUM SUCC 125 MG IJ SOLR
125.0000 mg | Freq: Once | INTRAMUSCULAR | Status: AC
  Administered 2014-09-18: 125 mg via INTRAVENOUS
  Filled 2014-09-18: qty 2

## 2014-09-18 MED ORDER — DIPHENHYDRAMINE HCL 50 MG/ML IJ SOLN
25.0000 mg | Freq: Once | INTRAMUSCULAR | Status: AC
  Administered 2014-09-18: 25 mg via INTRAVENOUS
  Filled 2014-09-18: qty 1

## 2014-09-18 MED ORDER — FAMOTIDINE 20 MG PO TABS: 20.0000 mg | ORAL_TABLET | Freq: Two times a day (BID) | ORAL | Status: DC

## 2014-09-18 MED ORDER — FAMOTIDINE IN NACL 20-0.9 MG/50ML-% IV SOLN
20.0000 mg | Freq: Once | INTRAVENOUS | Status: AC
  Administered 2014-09-18: 20 mg via INTRAVENOUS
  Filled 2014-09-18: qty 50

## 2014-09-18 NOTE — ED Notes (Signed)
She c/o upper (and to a much less degree lower) lip swelling at about 1000 hours today.  She cites having eaten deviled eggs; also she takes Lisinopril every morning, which she did this morning.  She denies any tongue or throat swelling and is in no distress and is breathing normally.

## 2014-09-18 NOTE — Discharge Instructions (Signed)
Take benadryl 25 mg every 4-6 hours for swelling to lip.   Stop taking your bp meds.  Follow up with your md this week.

## 2014-09-18 NOTE — ED Provider Notes (Signed)
CSN: [id number]     Arrival date & time 09/18/14  1634 History   First MD Initiated Contact with Patient 09/18/14 1650     Chief Complaint  Patient presents with  . Allergic Reaction     (Consider location/radiation/quality/duration/timing/severity/associated sxs/prior Treatment) Patient is a 39 y.o. female presenting with allergic reaction. The history is provided by the patient (Patient state that she started with her upper lip swelling today. She states she has never had a problem like this before. Patient is taking lisinopril for blood pressure medicine.).  Allergic Reaction Presenting symptoms: no difficulty breathing and no rash   Severity:  Moderate Prior allergic episodes:  No prior episodes Context: no animal exposure   Relieved by:  Nothing Worsened by:  Nothing tried Ineffective treatments:  Antihistamines   Past Medical History  Diagnosis Date  . Hypertension    Past Surgical History  Procedure Laterality Date  . Tubal ligation     No family history on file. Social History  Substance Use Topics  . Smoking status: Current Every Day Smoker    Types: Cigarettes  . Smokeless tobacco: None  . Alcohol Use: Yes     Comment: Usually drinks "once a while"   OB History    No data available     Review of Systems  Constitutional: Negative for appetite change and fatigue.  HENT: Negative for congestion, ear discharge and sinus pressure.        Swelling to upper lip  Eyes: Negative for discharge.  Respiratory: Negative for cough.   Cardiovascular: Negative for chest pain.  Gastrointestinal: Negative for abdominal pain and diarrhea.  Genitourinary: Negative for frequency and hematuria.  Musculoskeletal: Negative for back pain.  Skin: Negative for rash.  Neurological: Negative for seizures and headaches.  Psychiatric/Behavioral: Negative for hallucinations.      Allergies  Review of patient's allergies indicates no known allergies.  Home Medications   Prior  to Admission medications   Medication Sig Start Date End Date Taking? Authorizing Provider  albuterol (PROVENTIL HFA;VENTOLIN HFA) 108 (90 BASE) MCG/ACT inhaler Inhale 2 puffs into the lungs every 4 (four) hours as needed for wheezing or shortness of breath. 03/15/14  Yes Harle Battiest, NP  Aspirin-Salicylamide-Caffeine (BC HEADACHE POWDER PO) Take 1 packet by mouth daily as needed (headache).   Yes Historical Provider, MD  lisinopril-hydrochlorothiazide (PRINZIDE) 20-12.5 MG per tablet Take 1 tablet by mouth daily. 03/15/14  Yes Harle Battiest, NP  Phenylephrine-DM-GG-APAP (MUCINEX FAST-MAX) 5-10-200-325 MG CAPS Take 2 capsules by mouth 3 (three) times daily. Patient taking differently: Take 2 capsules by mouth 3 (three) times daily as needed (cough/congestion/ cold symptoms).  03/15/14  Yes Harle Battiest, NP  famotidine (PEPCID) 20 MG tablet Take 1 tablet (20 mg total) by mouth 2 (two) times daily. 09/18/14   Bethann Berkshire, MD  predniSONE (DELTASONE) 10 MG tablet Take 2 tablets (20 mg total) by mouth daily. 09/18/14   Bethann Berkshire, MD   BP 99/61 mmHg  Pulse 91  Temp(Src) 98.2 F (36.8 C) (Oral)  Resp 18  SpO2 98% Physical Exam  Constitutional: She is oriented to person, place, and time. She appears well-developed.  HENT:  Head: Normocephalic.  Swollen upper lip,  Inside mouth shows no swelling  Eyes: Conjunctivae and EOM are normal. No scleral icterus.  Neck: Neck supple. No thyromegaly present.  Cardiovascular: Normal rate and regular rhythm.  Exam reveals no gallop and no friction rub.   No murmur heard. Pulmonary/Chest: No stridor. She has no wheezes.  She has no rales. She exhibits no tenderness.  Abdominal: She exhibits no distension. There is no tenderness. There is no rebound.  Musculoskeletal: Normal range of motion. She exhibits no edema.  Lymphadenopathy:    She has no cervical adenopathy.  Neurological: She is oriented to person, place, and time. She exhibits normal  muscle tone. Coordination normal.  Skin: No rash noted. No erythema.  Psychiatric: She has a normal mood and affect. Her behavior is normal.    ED Course  Procedures (including critical care time) Labs Review Labs Reviewed - No data to display  Imaging Review No results found. I have personally reviewed and evaluated these images and lab results as part of my medical decision-making.   EKG Interpretation None      MDM   Final diagnoses:  Allergic reaction, initial encounter    Allergic reaction. Most likely secondary to lisinopril. Patient had some improvement with treatment in emergency department. Will continue prednisone Pepcid and Benadryl. Stop lisinopril. Patient is to follow-up with PCP this week    Bethann Berkshire, MD 09/18/14 [phone]

## 2015-05-30 ENCOUNTER — Encounter (HOSPITAL_COMMUNITY): Payer: Self-pay | Admitting: *Deleted

## 2015-05-30 ENCOUNTER — Emergency Department (HOSPITAL_COMMUNITY)
Admission: EM | Admit: 2015-05-30 | Discharge: 2015-05-30 | Disposition: A | Discharge status: Home / Self Care | Payer: Self-pay | Attending: Emergency Medicine | Admitting: Emergency Medicine

## 2015-05-30 DIAGNOSIS — Y939 Activity, unspecified: Secondary | ICD-10-CM | POA: Insufficient documentation

## 2015-05-30 DIAGNOSIS — Z79899 Other long term (current) drug therapy: Secondary | ICD-10-CM | POA: Insufficient documentation

## 2015-05-30 DIAGNOSIS — S80862A Insect bite (nonvenomous), left lower leg, initial encounter: Secondary | ICD-10-CM | POA: Insufficient documentation

## 2015-05-30 DIAGNOSIS — W57XXXA Bitten or stung by nonvenomous insect and other nonvenomous arthropods, initial encounter: Secondary | ICD-10-CM | POA: Insufficient documentation

## 2015-05-30 DIAGNOSIS — F17210 Nicotine dependence, cigarettes, uncomplicated: Secondary | ICD-10-CM | POA: Insufficient documentation

## 2015-05-30 DIAGNOSIS — Y999 Unspecified external cause status: Secondary | ICD-10-CM | POA: Insufficient documentation

## 2015-05-30 DIAGNOSIS — S90862A Insect bite (nonvenomous), left foot, initial encounter: Secondary | ICD-10-CM | POA: Insufficient documentation

## 2015-05-30 DIAGNOSIS — R21 Rash and other nonspecific skin eruption: Secondary | ICD-10-CM

## 2015-05-30 DIAGNOSIS — Z7951 Long term (current) use of inhaled steroids: Secondary | ICD-10-CM | POA: Insufficient documentation

## 2015-05-30 DIAGNOSIS — I10 Essential (primary) hypertension: Secondary | ICD-10-CM | POA: Insufficient documentation

## 2015-05-30 DIAGNOSIS — Y929 Unspecified place or not applicable: Secondary | ICD-10-CM | POA: Insufficient documentation

## 2015-05-30 DIAGNOSIS — Z7982 Long term (current) use of aspirin: Secondary | ICD-10-CM | POA: Insufficient documentation

## 2015-05-30 MED ORDER — TRIAMCINOLONE ACETONIDE 0.1 % EX CREA: 1.0000 "application " | TOPICAL_CREAM | Freq: Two times a day (BID) | CUTANEOUS | Status: AC

## 2015-05-30 NOTE — ED Notes (Signed)
Per pt report: Pt pulled a bug that could have been a tick off her left knee area.  Pt also reports a rash has developed on her left foot near her small toes.  Pt a/o x 4 and ambulatory.

## 2015-05-30 NOTE — ED Provider Notes (Signed)
CSN: [id number]     Arrival date & time 05/30/15  0809 History   First MD Initiated Contact with Patient 05/30/15 0830     Chief Complaint  Patient presents with  . Rash   HPI Comments: 40 year old female presents with a rash on her left leg and foot. She states she pulled a tick off her left leg 3 weeks ago. She does report picking at it and poking it with a "hot pen" and using witch hazel on it. Denies fever, chills, drainage from wound, abdominal pain, N/V, generalized rash. She also reports a rash on the dorsal aspect of her left foot which started yesterday. She does use sandals frequently. Reports it is very itchy and burns. No one at home has a similar rash.  Patient is a 40 y.o. female presenting with rash.  Rash Associated symptoms: no abdominal pain, no fever, no nausea and not vomiting     Past Medical History  Diagnosis Date  . Hypertension    Past Surgical History  Procedure Laterality Date  . Tubal ligation     No family history on file. Social History  Substance Use Topics  . Smoking status: Current Every Day Smoker -- 0.15 packs/day    Types: Cigarettes  . Smokeless tobacco: None  . Alcohol Use: Yes     Comment: Usually drinks "once a while"   OB History    No data available     Review of Systems  Constitutional: Negative for fever.  Gastrointestinal: Negative for nausea, vomiting and abdominal pain.  Skin: Positive for rash and wound.  All other systems reviewed and are negative.   Allergies  Review of patient's allergies indicates no known allergies.  Home Medications   Prior to Admission medications   Medication Sig Start Date End Date Taking? Authorizing Provider  albuterol (PROVENTIL HFA;VENTOLIN HFA) 108 (90 BASE) MCG/ACT inhaler Inhale 2 puffs into the lungs every 4 (four) hours as needed for wheezing or shortness of breath. 03/15/14  Yes Harle Battiest, NP  Aspirin-Salicylamide-Caffeine (BC HEADACHE POWDER PO) Take 1 packet by mouth daily as  needed (headache).   Yes Historical Provider, MD  famotidine (PEPCID) 20 MG tablet Take 1 tablet (20 mg total) by mouth 2 (two) times daily. Patient not taking: Reported on 05/30/2015 09/18/14   Bethann Berkshire, MD  lisinopril-hydrochlorothiazide (PRINZIDE) 20-12.5 MG per tablet Take 1 tablet by mouth daily. Patient not taking: Reported on 05/30/2015 03/15/14   Harle Battiest, NP   BP 152/111 mmHg  Pulse 95  Temp(Src) 98.1 F (36.7 C) (Oral)  Resp 16  SpO2 100%  LMP 04/29/2015   Physical Exam  Constitutional: She is oriented to person, place, and time. She appears well-developed and well-nourished. No distress.  HENT:  Head: Normocephalic and atraumatic.  Eyes: Conjunctivae are normal. Pupils are equal, round, and reactive to light. Right eye exhibits no discharge. Left eye exhibits no discharge. No scleral icterus.  Neck: Normal range of motion.  Pulmonary/Chest: Effort normal.  Neurological: She is alert and oriented to person, place, and time.  Skin: Skin is warm and dry. Rash noted.  L leg: Scab with surrounding mild erythema and induration. No drainage or evidence of abscess formation.  L foot: 4cm circular patchy erythema. No tenderness to palpation.  Psychiatric: She has a normal mood and affect.    ED Course  Procedures (including critical care time) Labs Review Labs Reviewed - No data to display  Imaging Review No results found. I have personally reviewed and  evaluated these images and lab results as part of my medical decision-making.   EKG Interpretation None      MDM   Final diagnoses:  Rash  Insect bite   40 year old female presents with rash on her leg from an insect bite on her foot. No signs of infection. Appears to be mostly inflammation. Patient is afebrile. She is hypertensive however she has a history of hypertension. Will treat with steroid cream. Do not think that the rash on her foot is ring worm. It is not raised, crusty, scaling. Instructed patient  that if symptoms get worse with steroid cream to try Lamisil OTC. Patient is NAD, non-toxic, with stable VS. Patient is informed of clinical course, understands medical decision making process, and agrees with plan. Opportunity for questions provided and all questions answered. Return precautions given.     Bethel BornKelly Marie Reyli Schroth, PA-C 05/30/15 1016  Laurence Spatesachel Morgan Little, MD 05/31/15 [phone]

## 2015-05-30 NOTE — ED Notes (Signed)
Bed: [id number] Expected date:  Expected time:  Means of arrival:  Comments: EMS: syncopal/neck pain

## 2016-08-22 ENCOUNTER — Emergency Department (HOSPITAL_COMMUNITY)
Admission: EM | Admit: 2016-08-22 | Discharge: 2016-08-22 | Disposition: A | Discharge status: Home / Self Care | Payer: Self-pay | Attending: Emergency Medicine | Admitting: Emergency Medicine

## 2016-08-22 ENCOUNTER — Encounter: Payer: Self-pay | Admitting: Emergency Medicine

## 2016-08-22 DIAGNOSIS — Z79899 Other long term (current) drug therapy: Secondary | ICD-10-CM | POA: Insufficient documentation

## 2016-08-22 DIAGNOSIS — H1032 Unspecified acute conjunctivitis, left eye: Secondary | ICD-10-CM | POA: Insufficient documentation

## 2016-08-22 DIAGNOSIS — I10 Essential (primary) hypertension: Secondary | ICD-10-CM | POA: Insufficient documentation

## 2016-08-22 DIAGNOSIS — F17210 Nicotine dependence, cigarettes, uncomplicated: Secondary | ICD-10-CM | POA: Insufficient documentation

## 2016-08-22 MED ORDER — HYPROMELLOSE (GONIOSCOPIC) 2.5 % OP SOLN: 1.0000 [drp] | OPHTHALMIC | 12 refills | Status: AC | PRN

## 2016-08-22 MED ORDER — FLUORESCEIN SODIUM 0.6 MG OP STRP
1.0000 | ORAL_STRIP | Freq: Once | OPHTHALMIC | Status: AC
  Administered 2016-08-22: 1 via OPHTHALMIC

## 2016-08-22 MED ORDER — ERYTHROMYCIN 5 MG/GM OP OINT: TOPICAL_OINTMENT | OPHTHALMIC | 0 refills | Status: AC

## 2016-08-22 MED ORDER — TETRACAINE HCL 0.5 % OP SOLN
2.0000 [drp] | Freq: Once | OPHTHALMIC | Status: AC
  Administered 2016-08-22: 2 [drp] via OPHTHALMIC
  Filled 2016-08-22: qty 4

## 2016-08-22 MED ORDER — IBUPROFEN 200 MG PO TABS
600.0000 mg | ORAL_TABLET | Freq: Once | ORAL | Status: AC
  Administered 2016-08-22: 600 mg via ORAL
  Filled 2016-08-22: qty 3

## 2016-08-22 NOTE — Discharge Instructions (Signed)
This is likely a viral conjunctivitis however have given you antibiotic ointment to use if symptoms persist. Use the artificial tears. Cold compresses. Use sunglasses to help from irritation from the sun. I would discard all makeup brushes and makeup. Watch your hands thoroughly. Follow-up with the eye doctor if symptoms not improving by Monday or sooner if symptoms worsen.

## 2016-08-22 NOTE — ED Triage Notes (Signed)
Pt states she has had L eye redness and itching for several days. Alert and oriented.

## 2016-08-22 NOTE — ED Provider Notes (Signed)
[facility]-EMERGENCY DEPT Provider Note   CSN: [id number] Arrival date & time: 08/22/16  1203     History   Chief Complaint Chief Complaint  Patient presents with  . Eye Drainage    HPI Erin Case is a 41 y.o. female.  HPI 41 year old African-American female past medical history significant for hypertension presents to the ED today with complaints of left eye redness, itching, pain, discharge. Patient states that her symptoms started approximately 3-4 days ago. Denies any sick contacts. States that she has woken up in the morning with crusted over eye. She reports some purulent discharge. Patient reports photophobia and some mild eye discomfort. She reports normal vision. Patient does not wear contact lenses. Endorses associated rhinorrhea, nasal congestion, sinus pressure. Denies any fevers, chills, headache, nausea, emesis. Patient is not having for her symptoms prior to arrival. Nothing makes better or worse. Past Medical History:  Diagnosis Date  . Hypertension     Patient Active Problem List   Diagnosis Date Noted  . BACTERIAL VAGINITIS 03/06/2009  . CANDIDIASIS OF VULVA AND VAGINA 05/09/2008  . INSOMNIA 12/15/2007  . TINEA CORPORIS 07/10/2007  . HYPERTENSION, BENIGN ESSENTIAL 07/10/2007  . HEMATURIA UNSPECIFIED 05/11/2007  . DERMATITIS, SEBORRHEIC 04/22/2007  . LATERAL EPICONDYLITIS, LEFT 09/24/2006  . DISORDER, TOBACCO USE 09/22/2006  . VULVOVAGINITIS, TRICHOMONAL 11/21/2003    Past Surgical History:  Procedure Laterality Date  . TUBAL LIGATION      OB History    No data available       Home Medications    Prior to Admission medications   Medication Sig Start Date End Date Taking? Authorizing Provider  albuterol (PROVENTIL HFA;VENTOLIN HFA) 108 (90 BASE) MCG/ACT inhaler Inhale 2 puffs into the lungs every 4 (four) hours as needed for wheezing or shortness of breath. 03/15/14   Harle Battiest, NP  Aspirin-Salicylamide-Caffeine (BC HEADACHE POWDER PO)  Take 1 packet by mouth daily as needed (headache).    [provider]  erythromycin ophthalmic ointment Place a 1/2 inch ribbon of ointment into the lower eyelid 4 times per day for 5 days 08/22/16   Demetrios Loll T, PA-C  hydroxypropyl methylcellulose / hypromellose (ISOPTO TEARS / GONIOVISC) 2.5 % ophthalmic solution Place 1 drop into the left eye as needed for dry eyes. 08/22/16   Demetrios Loll T, PA-C  triamcinolone cream (KENALOG) 0.1 % Apply 1 application topically 2 (two) times daily. 05/30/15   Bethel Born, PA-C    Family History No family history on file.  Social History Social History  Substance Use Topics  . Smoking status: Current Every Day Smoker    Packs/day: 0.15    Types: Cigarettes  . Smokeless tobacco: Not on file  . Alcohol use Yes     Comment: Usually drinks "once a while"     Allergies   Patient has no known allergies.   Review of Systems Review of Systems  Constitutional: Negative for chills and fever.  Eyes: Positive for photophobia, pain, discharge, redness and itching. Negative for visual disturbance.  Gastrointestinal: Negative for nausea and vomiting.  Musculoskeletal: Negative for neck pain and neck stiffness.  Neurological: Negative for dizziness and headaches.     Physical Exam Updated Vital Signs BP (!) 146/118 (BP Location: Left Arm)   Pulse 91   Temp 98.6 F (37 C) (Oral)   Resp 18   LMP 08/15/2016   SpO2 100%   Physical Exam  Constitutional: She appears well-developed and well-nourished. No distress.  HENT:  Head: Normocephalic and  atraumatic.  Nose: Nose normal.  Mouth/Throat: Oropharynx is clear and moist.  Eyes: Pupils are equal, round, and reactive to light. EOM and lids are normal. Lids are everted and swept, no foreign bodies found. Right eye exhibits no discharge. Left eye exhibits no discharge. Right conjunctiva is not injected. Right conjunctiva has no hemorrhage. Left conjunctiva is injected. Left  conjunctiva has no hemorrhage. No scleral icterus.  Slit lamp exam:      The left eye shows no corneal abrasion, no corneal ulcer, no foreign body, no hyphema and no fluorescein uptake.  Intraocular pressure 13  Neck: Normal range of motion. Neck supple.  Pulmonary/Chest: No respiratory distress.  Musculoskeletal: Normal range of motion.  Neurological: She is alert.  Skin: Skin is warm and dry. Capillary refill takes less than 2 seconds. No pallor.  Psychiatric: Her behavior is normal. Judgment and thought content normal.  Nursing note and vitals reviewed.    Visual Acuity  Right Eye Distance: 20/25 Left Eye Distance: 20/25 Bilateral Distance: 20/25  Right Eye Near:   Left Eye Near:    Bilateral Near:      ED Treatments / Results  Labs (all labs ordered are listed, but only abnormal results are displayed) Labs Reviewed - No data to display  EKG  EKG Interpretation None       Radiology No results found.  Procedures Procedures (including critical care time)  Medications Ordered in ED Medications  ibuprofen (ADVIL,MOTRIN) tablet 600 mg (600 mg Oral Given 08/22/16 1339)  tetracaine (PONTOCAINE) 0.5 % ophthalmic solution 2 drop (2 drops Left Eye Given 08/22/16 1339)  fluorescein ophthalmic strip 1 strip (1 strip Left Eye Given 08/22/16 1339)     Initial Impression / Assessment and Plan / ED Course  I have reviewed the triage vital signs and the nursing notes.  Pertinent labs & imaging results that were available during my care of the patient were reviewed by me and considered in my medical decision making (see chart for details).       Patient presentation consistent with conjunctivitis.  Purulent discharge. No corneal abrasions, entrapment, consensual photophobia, or dendritic staining with fluorescein study.  Presentation non-concerning for iritis, bacterial conjunctivitis, corneal abrasions, or HSV.  Visual acuity is normal.Ocular pressures are normal. Spoke with  ophthalmology who recommends conjunctivitis treatment and follow-up in their office on Monday. Patient given antibiotic ointment, artificial tears and encouraged symptomatically treatment at home.  Personal hygiene and frequent handwashing discussed.  Patient advised to followup with ophthalmologist if symptoms persist or worsen in any way including vision change or purulent discharge.  Patient verbalizes understanding and is agreeable with discharge.   Final Clinical Impressions(s) / ED Diagnoses   Final diagnoses:  Acute conjunctivitis of left eye, unspecified acute conjunctivitis type    New Prescriptions Discharge Medication List as of 08/22/2016  2:14 PM    START taking these medications   Details  erythromycin ophthalmic ointment Place a 1/2 inch ribbon of ointment into the lower eyelid 4 times per day for 5 days, Print    hydroxypropyl methylcellulose / hypromellose (ISOPTO TEARS / GONIOVISC) 2.5 % ophthalmic solution Place 1 drop into the left eye as needed for dry eyes., Starting Thu 08/22/2016, Print         Rise MuLeaphart, Kayveon Lennartz T, PA-C 08/22/16 1510    Melene PlanFloyd, Dan, DO 08/22/16 [phone]

## 2017-10-06 ENCOUNTER — Other Ambulatory Visit: Payer: Self-pay | Admitting: Nurse Practitioner

## 2017-10-06 DIAGNOSIS — Z1231 Encounter for screening mammogram for malignant neoplasm of breast: Secondary | ICD-10-CM

## 2017-11-10 ENCOUNTER — Ambulatory Visit
Admission: RE | Admit: 2017-11-10 | Discharge: 2017-11-10 | Disposition: A | Discharge status: Home / Self Care | Payer: BLUE CROSS/BLUE SHIELD | Source: Ambulatory Visit | Attending: Nurse Practitioner | Admitting: Nurse Practitioner

## 2017-11-10 DIAGNOSIS — Z1231 Encounter for screening mammogram for malignant neoplasm of breast: Secondary | ICD-10-CM

## 2017-11-12 ENCOUNTER — Other Ambulatory Visit: Payer: Self-pay | Admitting: Nurse Practitioner

## 2017-11-12 DIAGNOSIS — R928 Other abnormal and inconclusive findings on diagnostic imaging of breast: Secondary | ICD-10-CM

## 2017-11-17 ENCOUNTER — Ambulatory Visit
Admission: RE | Admit: 2017-11-17 | Discharge: 2017-11-17 | Disposition: A | Discharge status: Home / Self Care | Payer: BLUE CROSS/BLUE SHIELD | Source: Ambulatory Visit | Attending: Nurse Practitioner | Admitting: Nurse Practitioner

## 2017-11-17 ENCOUNTER — Other Ambulatory Visit: Payer: Self-pay | Admitting: Nurse Practitioner

## 2017-11-17 DIAGNOSIS — R928 Other abnormal and inconclusive findings on diagnostic imaging of breast: Secondary | ICD-10-CM

## 2017-11-17 DIAGNOSIS — N6489 Other specified disorders of breast: Secondary | ICD-10-CM

## 2018-02-14 ENCOUNTER — Encounter (HOSPITAL_COMMUNITY): Payer: Self-pay

## 2018-02-14 ENCOUNTER — Other Ambulatory Visit: Payer: Self-pay

## 2018-02-14 ENCOUNTER — Emergency Department (HOSPITAL_COMMUNITY)
Admission: EM | Admit: 2018-02-14 | Discharge: 2018-02-14 | Disposition: A | Discharge status: Home / Self Care | Payer: BLUE CROSS/BLUE SHIELD | Attending: Emergency Medicine | Admitting: Emergency Medicine

## 2018-02-14 DIAGNOSIS — I10 Essential (primary) hypertension: Secondary | ICD-10-CM | POA: Insufficient documentation

## 2018-02-14 DIAGNOSIS — F17210 Nicotine dependence, cigarettes, uncomplicated: Secondary | ICD-10-CM | POA: Diagnosis not present

## 2018-02-14 DIAGNOSIS — H1031 Unspecified acute conjunctivitis, right eye: Secondary | ICD-10-CM

## 2018-02-14 DIAGNOSIS — R0981 Nasal congestion: Secondary | ICD-10-CM | POA: Diagnosis present

## 2018-02-14 DIAGNOSIS — H5789 Other specified disorders of eye and adnexa: Secondary | ICD-10-CM

## 2018-02-14 MED ORDER — POLYMYXIN B-TRIMETHOPRIM 10000-0.1 UNIT/ML-% OP SOLN: 1.0000 [drp] | OPHTHALMIC | 0 refills | Status: AC

## 2018-02-14 MED ORDER — POLYMYXIN B-TRIMETHOPRIM 10000-0.1 UNIT/ML-% OP SOLN: 1.0000 [drp] | OPHTHALMIC | 0 refills | Status: DC

## 2018-02-14 NOTE — ED Triage Notes (Signed)
Pt has had runny nose and sneezing past couple of days.  No fever.  Eating and drinking as normal.  Pt woke up today with right eye swollen, painful and red.

## 2018-02-14 NOTE — ED Provider Notes (Signed)
[facility]-EMERGENCY DEPT Provider Note   CSN: [id number] Arrival date & time: 02/14/18  1962     History   Chief Complaint Chief Complaint  Patient presents with  . Eye Pain    HPI Erin Case is a 43 y.o. female.  HPI Patient reports he has had several days of sneezing, nasal congestion and slight cough.  She reports yesterday she started noticing some itching and discomfort of her right eye.  She tried some eyedrops over-the-counter.  She reports during the night she started getting some increased discomfort in white crusting.  She reports that the vision was slightly blurred until she had applied the warm compress.  If she is concerned that she has pinkeye.  She is a Conservation officer, nature at [employer].  She reports that she had a client whose child had pinkeye a couple of days ago. Past Medical History:  Diagnosis Date  . Hypertension     Patient Active Problem List   Diagnosis Date Noted  . BACTERIAL VAGINITIS 03/06/2009  . CANDIDIASIS OF VULVA AND VAGINA 05/09/2008  . INSOMNIA 12/15/2007  . TINEA CORPORIS 07/10/2007  . HYPERTENSION, BENIGN ESSENTIAL 07/10/2007  . HEMATURIA UNSPECIFIED 05/11/2007  . DERMATITIS, SEBORRHEIC 04/22/2007  . LATERAL EPICONDYLITIS, LEFT 09/24/2006  . DISORDER, TOBACCO USE 09/22/2006  . VULVOVAGINITIS, TRICHOMONAL 11/21/2003    Past Surgical History:  Procedure Laterality Date  . TUBAL LIGATION       OB History   No obstetric history on file.      Home Medications    Prior to Admission medications   Medication Sig Start Date End Date Taking? Authorizing Provider  albuterol (PROVENTIL HFA;VENTOLIN HFA) 108 (90 BASE) MCG/ACT inhaler Inhale 2 puffs into the lungs every 4 (four) hours as needed for wheezing or shortness of breath. 03/15/14   Harle Battiest, NP  Aspirin-Salicylamide-Caffeine (BC HEADACHE POWDER PO) Take 1 packet by mouth daily as needed (headache).    [provider]  erythromycin ophthalmic  ointment Place a 1/2 inch ribbon of ointment into the lower eyelid 4 times per day for 5 days 08/22/16   Demetrios Loll T, PA-C  hydroxypropyl methylcellulose / hypromellose (ISOPTO TEARS / GONIOVISC) 2.5 % ophthalmic solution Place 1 drop into the left eye as needed for dry eyes. 08/22/16   Demetrios Loll T, PA-C  triamcinolone cream (KENALOG) 0.1 % Apply 1 application topically 2 (two) times daily. 05/30/15   Bethel Born, PA-C  trimethoprim-polymyxin b (POLYTRIM) ophthalmic solution Place 1 drop into the right eye every 4 (four) hours for 7 days. 02/14/18 02/21/18  Arby Barrette, MD    Family History Family History  Problem Relation Age of Onset  . Breast cancer Neg Hx     Social History Social History   Tobacco Use  . Smoking status: Current Every Day Smoker    Packs/day: 0.15    Types: Cigarettes  . Smokeless tobacco: Never Used  Substance Use Topics  . Alcohol use: Yes    Comment: Usually drinks "once a while"  . Drug use: No     Allergies   Patient has no known allergies.   Review of Systems Review of Systems Constitutional: No fever no chills no myalgia or general weakness GI: No nausea no vomiting  Physical Exam Updated Vital Signs BP (!) 146/107 (BP Location: Right Arm)   Pulse (!) 106   Temp 98.3 F (36.8 C) (Oral)   Resp 18   Ht 5\' 2"  (1.575 m)   Wt 59 kg  LMP 01/28/2018 (Approximate)   SpO2 100%   BMI 23.78 kg/m   Physical Exam Constitutional:      Comments: Sitting at the stretcher clinically well in appearance.  No distress.  HENT:     Head: Normocephalic and atraumatic.     Nose: Nose normal.     Mouth/Throat:     Mouth: Mucous membranes are moist.     Comments: Airway widely patent no tonsillar erythema or exudate. Eyes:     Comments: No objective periorbital edema or lid erythema.  Eyes are symmetric.  Extraocular motions normal.  Pupils are symmetric and responsive.  Very mild scleral injection on the right.  Fundus no apparent  papilledema.  Pulmonary:     Effort: Pulmonary effort is normal.  Neurological:     General: No focal deficit present.     Mental Status: She is oriented to person, place, and time.     Coordination: Coordination normal.  Psychiatric:        Mood and Affect: Mood normal.      ED Treatments / Results  Labs (all labs ordered are listed, but only abnormal results are displayed) Labs Reviewed - No data to display  EKG None  Radiology No results found.  Procedures Procedures (including critical care time)  Medications Ordered in ED Medications - No data to display   Initial Impression / Assessment and Plan / ED Course  I have reviewed the triage vital signs and the nursing notes.  Pertinent labs & imaging results that were available during my care of the patient were reviewed by me and considered in my medical decision making (see chart for details).    Patient is clinically well.  She has had URI symptoms for couple of days.  She had noted now itching and redness of the right eye.  Exam does not show any periorbital edema or proptosis.  No suspicion for periorbital cellulitis.  Patient will be treated with Polytrim drops.  Return precautions reviewed.  Final Clinical Impressions(s) / ED Diagnoses   Final diagnoses:  Acute conjunctivitis of right eye, unspecified acute conjunctivitis type  Eye irritation    ED Discharge Orders         Ordered    trimethoprim-polymyxin b (POLYTRIM) ophthalmic solution  Every 4 hours,   Status:  Discontinued     02/14/18 0902    trimethoprim-polymyxin b (POLYTRIM) ophthalmic solution  Every 4 hours     02/14/18 0904           Arby Barrette, MD 02/14/18 [phone]

## 2018-02-14 NOTE — Discharge Instructions (Signed)
1.  Use eyedrops 4 times a day.  Use hand sanitizer and avoid touching or rubbing your eyes. 2.  Return to the emergency department if you are having any problems with your vision, worsening pain or swelling. 3.  See your family doctor for recheck in the next 3 to 5 days.

## 2018-05-18 ENCOUNTER — Ambulatory Visit
Admission: RE | Admit: 2018-05-18 | Discharge: 2018-05-18 | Disposition: A | Discharge status: Home / Self Care | Payer: BLUE CROSS/BLUE SHIELD | Source: Ambulatory Visit | Attending: Nurse Practitioner | Admitting: Nurse Practitioner

## 2018-05-18 ENCOUNTER — Other Ambulatory Visit: Payer: Self-pay

## 2018-05-18 DIAGNOSIS — N6489 Other specified disorders of breast: Secondary | ICD-10-CM

## 2018-10-22 ENCOUNTER — Emergency Department (HOSPITAL_COMMUNITY): Payer: BC Managed Care – PPO

## 2018-10-22 ENCOUNTER — Encounter (HOSPITAL_COMMUNITY): Payer: Self-pay | Admitting: Emergency Medicine

## 2018-10-22 ENCOUNTER — Emergency Department (HOSPITAL_COMMUNITY)
Admission: EM | Admit: 2018-10-22 | Discharge: 2018-10-22 | Disposition: A | Discharge status: Home / Self Care | Payer: BC Managed Care – PPO | Attending: Emergency Medicine | Admitting: Emergency Medicine

## 2018-10-22 ENCOUNTER — Other Ambulatory Visit: Payer: Self-pay

## 2018-10-22 DIAGNOSIS — F17210 Nicotine dependence, cigarettes, uncomplicated: Secondary | ICD-10-CM | POA: Insufficient documentation

## 2018-10-22 DIAGNOSIS — M545 Low back pain, unspecified: Secondary | ICD-10-CM

## 2018-10-22 DIAGNOSIS — M546 Pain in thoracic spine: Secondary | ICD-10-CM | POA: Insufficient documentation

## 2018-10-22 DIAGNOSIS — M25511 Pain in right shoulder: Secondary | ICD-10-CM | POA: Diagnosis not present

## 2018-10-22 DIAGNOSIS — I10 Essential (primary) hypertension: Secondary | ICD-10-CM | POA: Insufficient documentation

## 2018-10-22 LAB — POC URINE PREG, ED: Preg Test, Ur: NEGATIVE

## 2018-10-22 MED ORDER — IBUPROFEN 600 MG PO TABS: 600.0000 mg | ORAL_TABLET | Freq: Four times a day (QID) | ORAL | 0 refills | Status: AC | PRN

## 2018-10-22 MED ORDER — METHOCARBAMOL 500 MG PO TABS: 500.0000 mg | ORAL_TABLET | Freq: Two times a day (BID) | ORAL | 0 refills | Status: AC

## 2018-10-22 MED ORDER — IBUPROFEN 200 MG PO TABS
600.0000 mg | ORAL_TABLET | Freq: Once | ORAL | Status: AC
  Administered 2018-10-22: 600 mg via ORAL
  Filled 2018-10-22: qty 3
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
## 2018-10-22 NOTE — Discharge Instructions (Signed)
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
## 2018-12-01 ENCOUNTER — Other Ambulatory Visit: Payer: Self-pay | Admitting: Nurse Practitioner

## 2018-12-01 DIAGNOSIS — Z1231 Encounter for screening mammogram for malignant neoplasm of breast: Secondary | ICD-10-CM

## 2019-01-25 ENCOUNTER — Ambulatory Visit
Admission: RE | Admit: 2019-01-25 | Discharge: 2019-01-25 | Disposition: A | Discharge status: Home / Self Care | Payer: BC Managed Care – PPO | Source: Ambulatory Visit | Attending: Nurse Practitioner | Admitting: Nurse Practitioner

## 2019-01-25 ENCOUNTER — Other Ambulatory Visit: Payer: Self-pay

## 2019-01-25 DIAGNOSIS — Z1231 Encounter for screening mammogram for malignant neoplasm of breast: Secondary | ICD-10-CM

## 2019-08-16 IMAGING — US ULTRASOUND RIGHT BREAST LIMITED
1 series · 5 of 5 positions shown · non-contrast
Comparison: Previous exam(s).

CLINICAL DATA: Patient was called back for a right breast asymmetry
laterally.

EXAM:
DIGITAL DIAGNOSTIC RIGHT MAMMOGRAM WITH CAD AND TOMO
ULTRASOUND RIGHT BREAST

[Series 1: ultrasound right breast limited · 0.07mm/px · 5 of 5 slices shown]
[im 1/5]
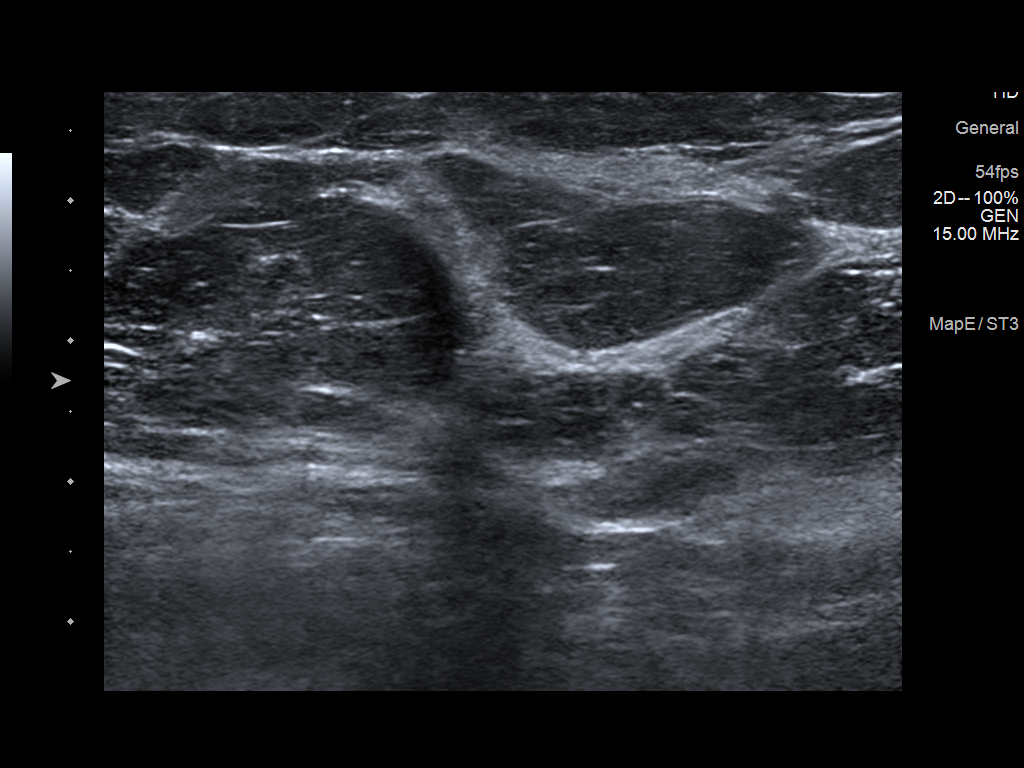
[im 2/5]
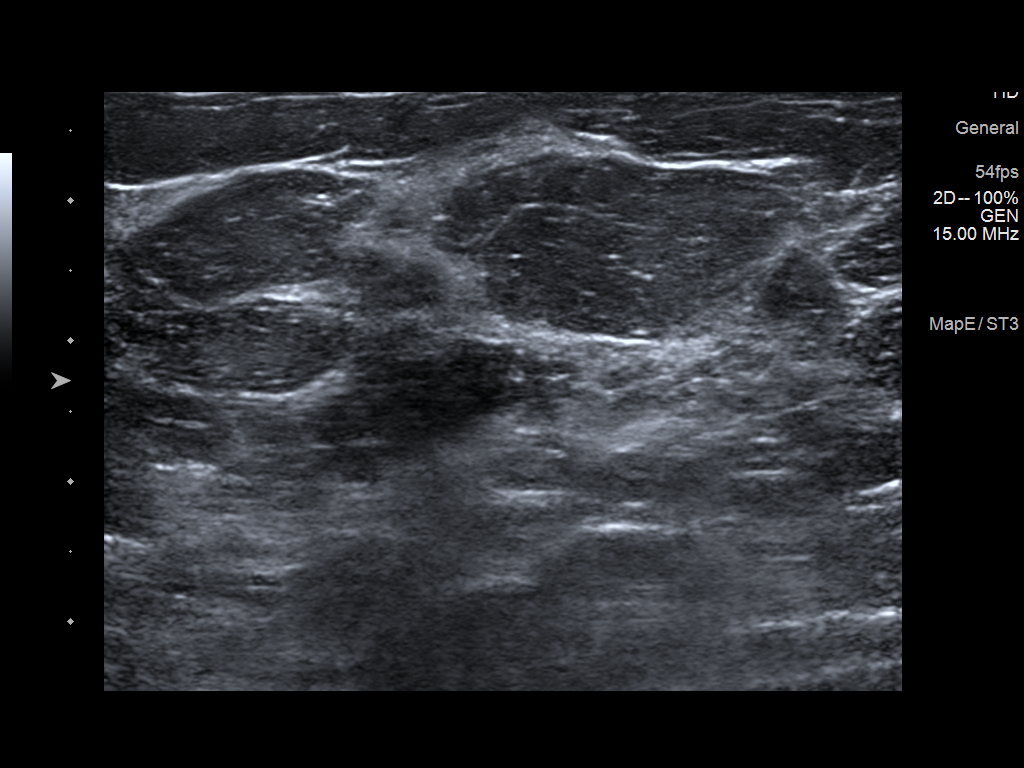
[im 3/5]
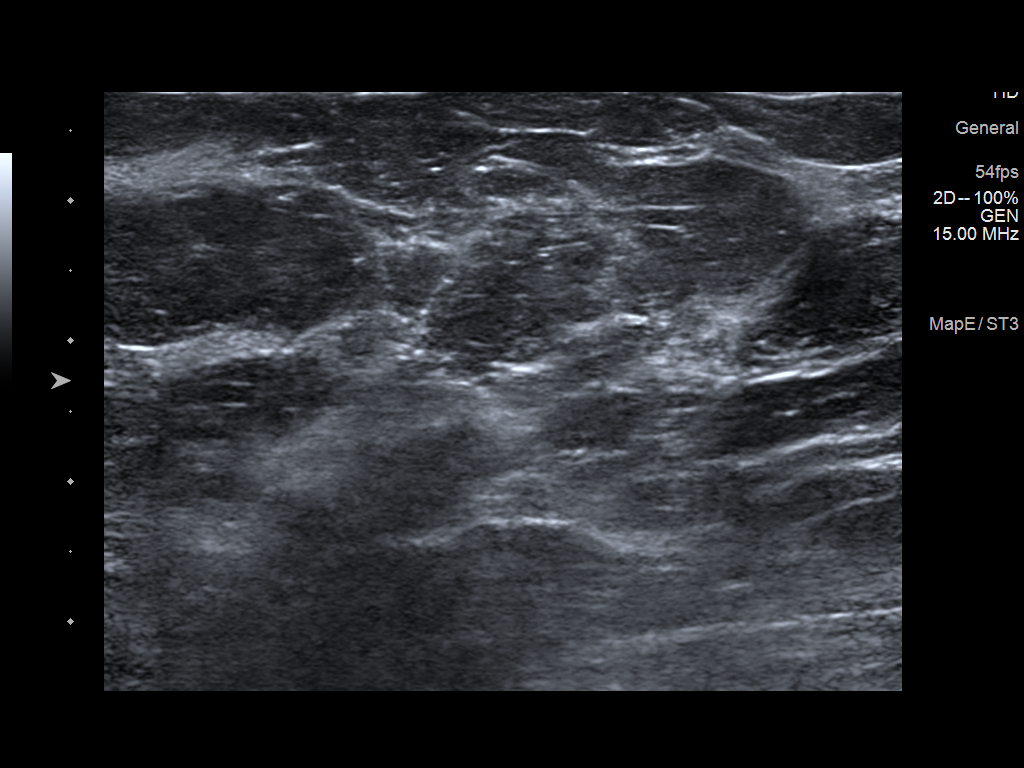
[im 4/5]
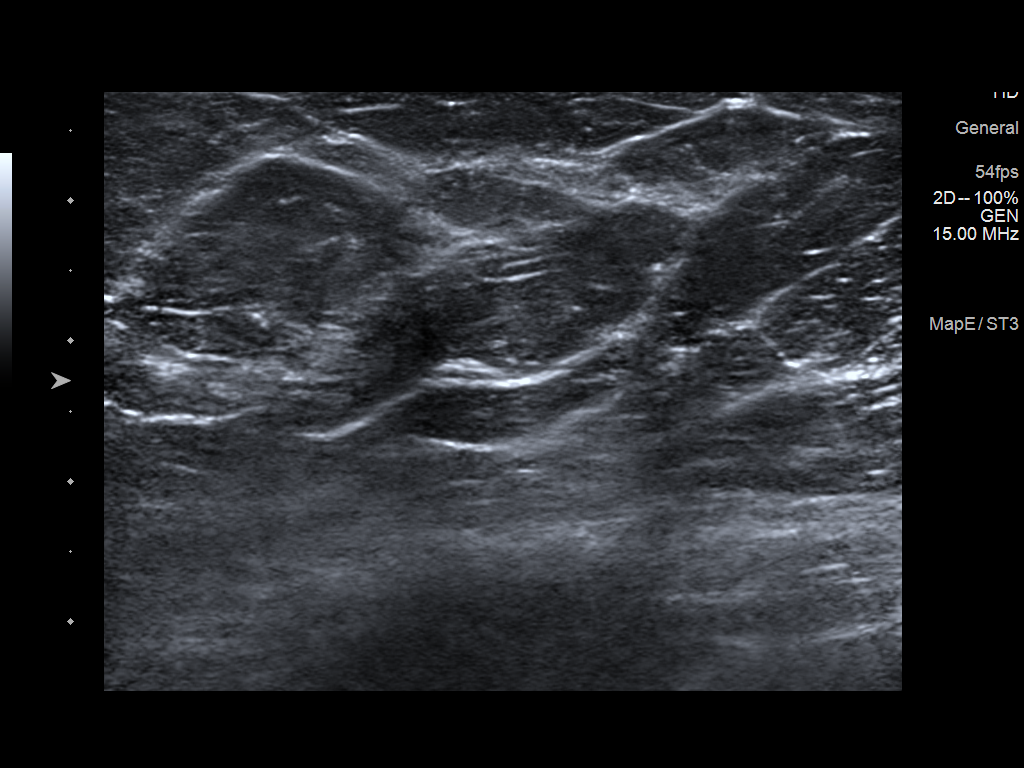
[im 5/5]
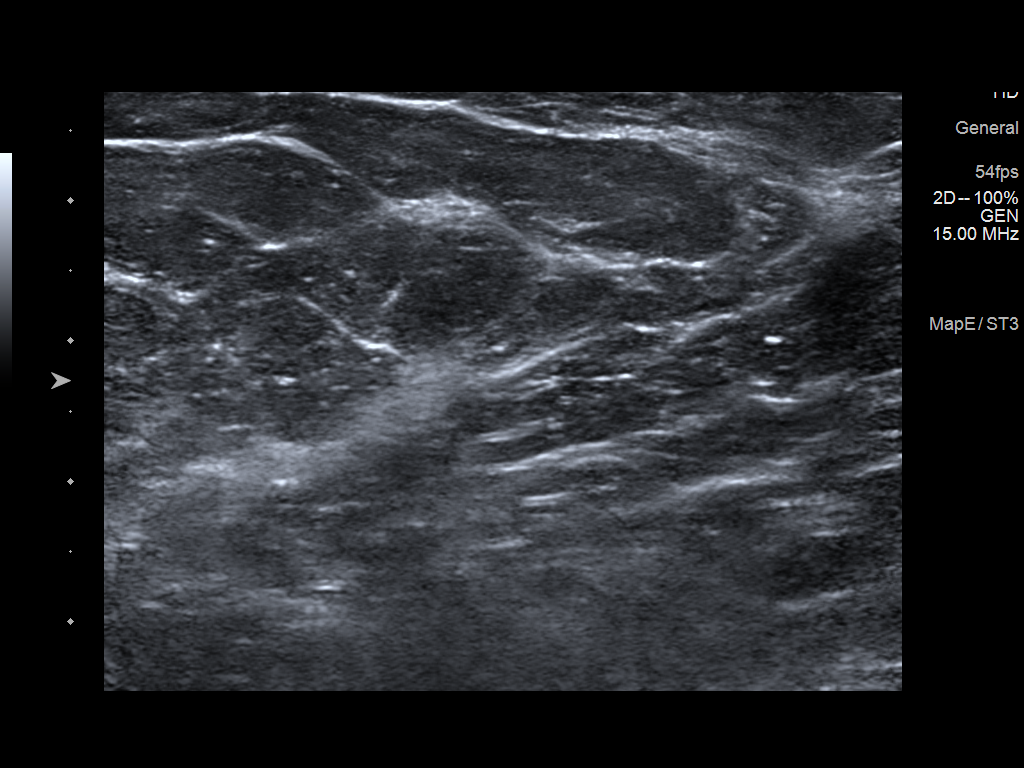

[5 of 5 positions shown; findings below may reference images not displayed]

ACR Breast Density Category b: There are scattered areas of
fibroglandular density.
FINDINGS: The lateral right breast asymmetry resolves on additional imaging.
There is an asymmetry seen medially which persists on additional
imaging.

Mammographic images were processed with CAD.

On physical exam, no suspicious lumps are identified.

Targeted ultrasound is performed, showing an island of dense
glandular tissue at [DATE] which may correlate with the mammographic
finding. No suspicious masses.
IMPRESSION: Probably benign right breast asymmetry.

RECOMMENDATION:
Recommend six-month follow-up mammography of the probably benign
medial right breast asymmetry.

I have discussed the findings and recommendations with the patient.
Results were also provided in writing at the conclusion of the
visit. If applicable, a reminder letter will be sent to the patient
regarding the next appointment.

BI-RADS CATEGORY  3: Probably benign.

## 2019-08-16 IMAGING — MG DIGITAL DIAGNOSTIC UNILATERAL RIGHT MAMMOGRAM WITH TOMO AND CAD
6 series · 6 of 18 positions shown · non-contrast
Comparison: Previous exam(s).

CLINICAL DATA: Patient was called back for a right breast asymmetry
laterally.

EXAM:
DIGITAL DIAGNOSTIC RIGHT MAMMOGRAM WITH CAD AND TOMO
ULTRASOUND RIGHT BREAST

[R MLO synth-2D]
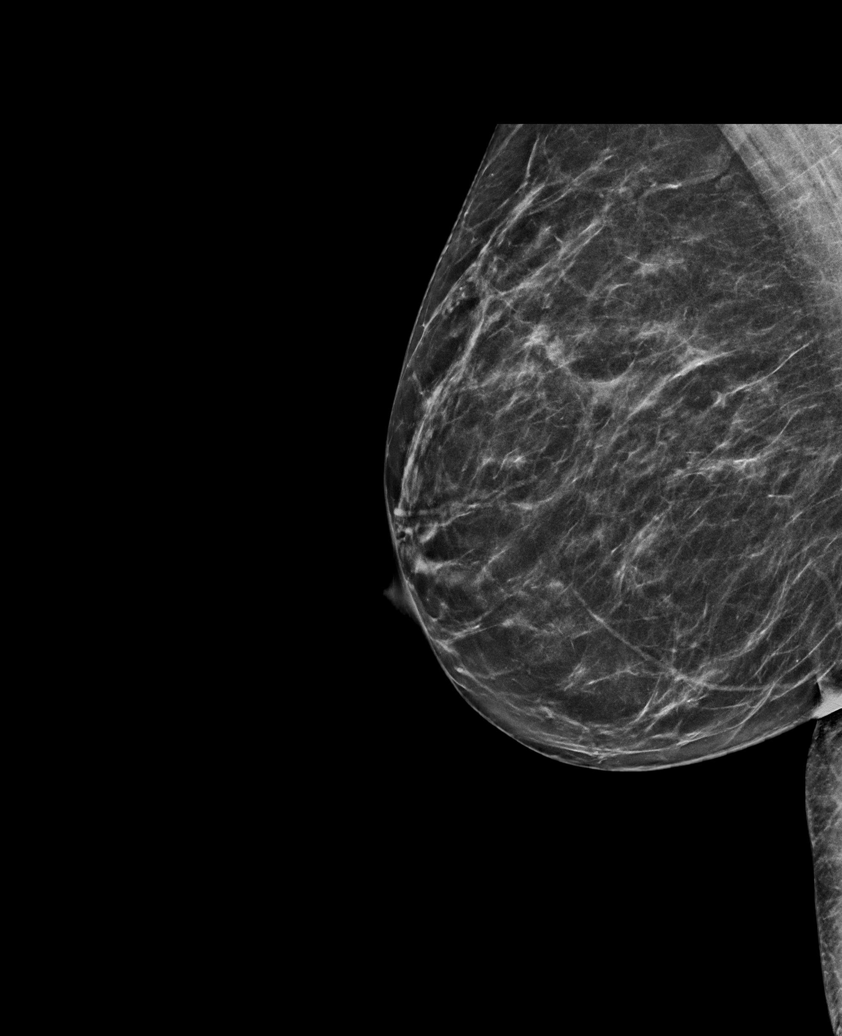

[R CC synth-2D (1 of 2)]
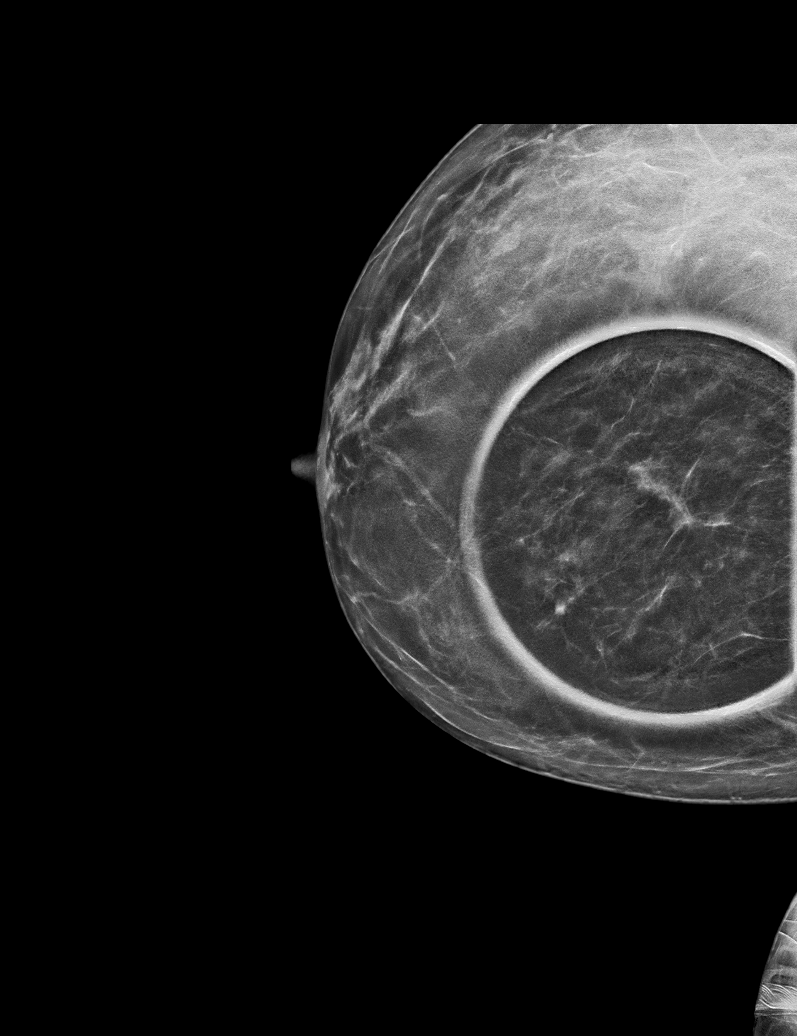

[R CC synth-2D (2 of 2)]
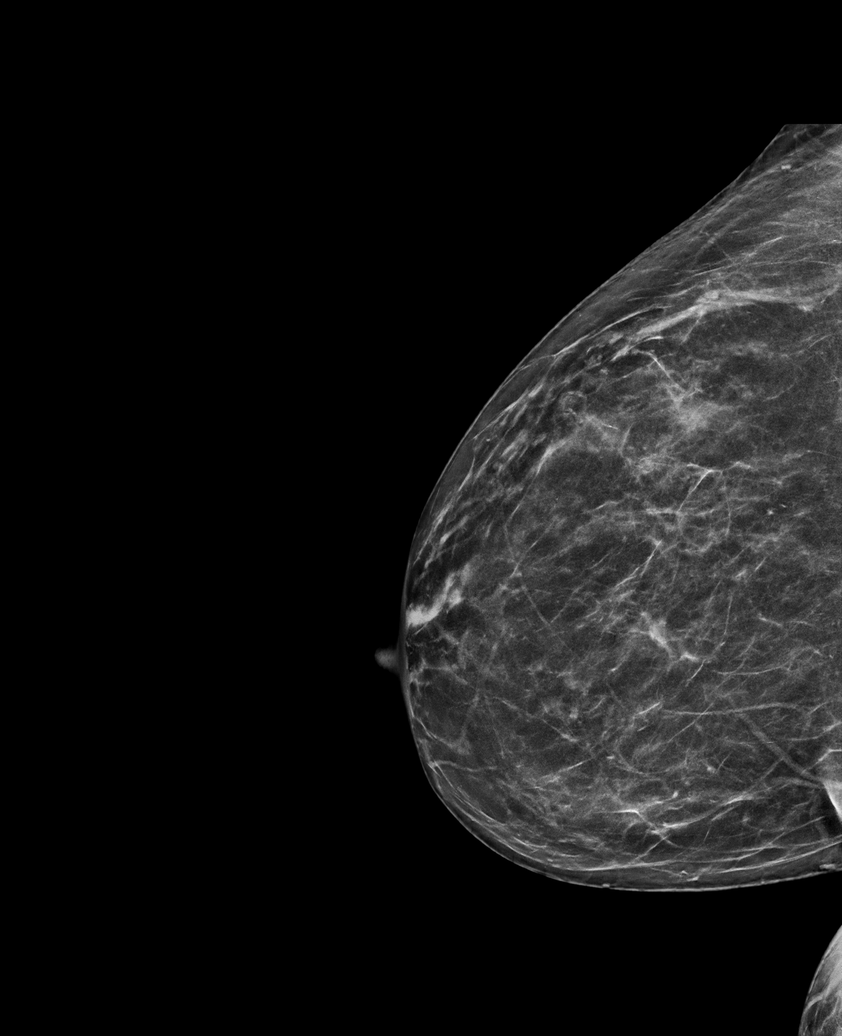

[R MLO tomo · tomo slice 33/64.0]
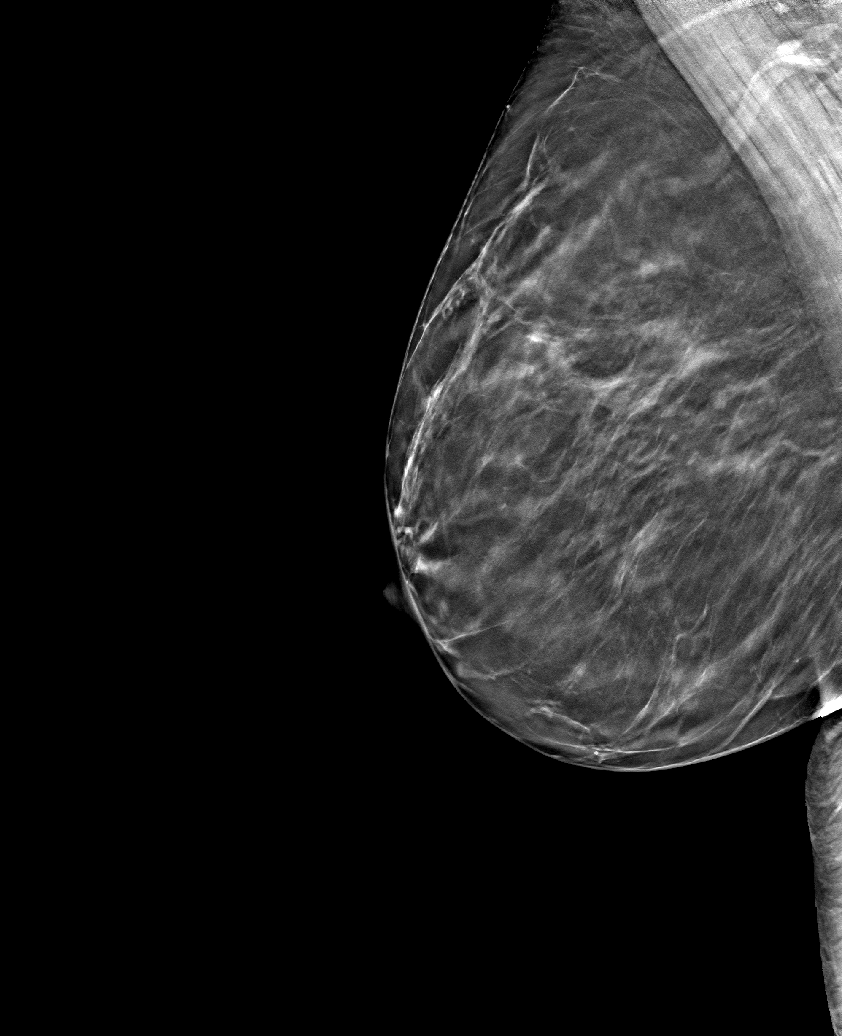

[R CC tomo (1 of 2) · tomo slice 32/63.0]
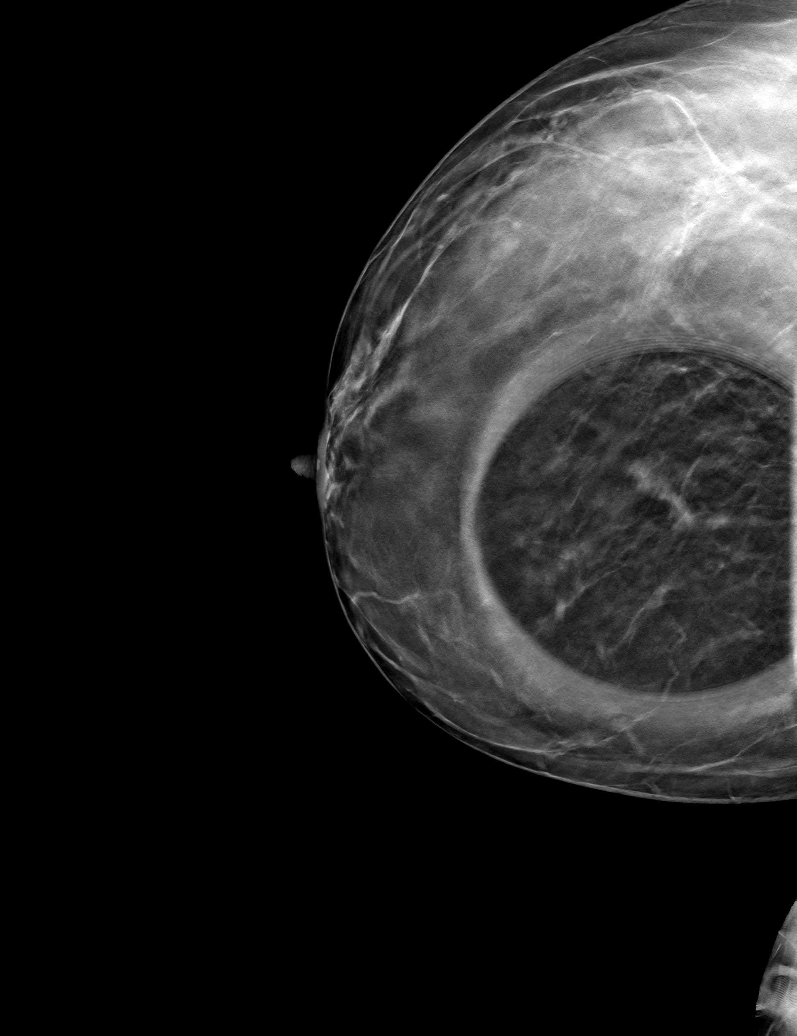

[R CC tomo (2 of 2) · tomo slice 34/67.0]
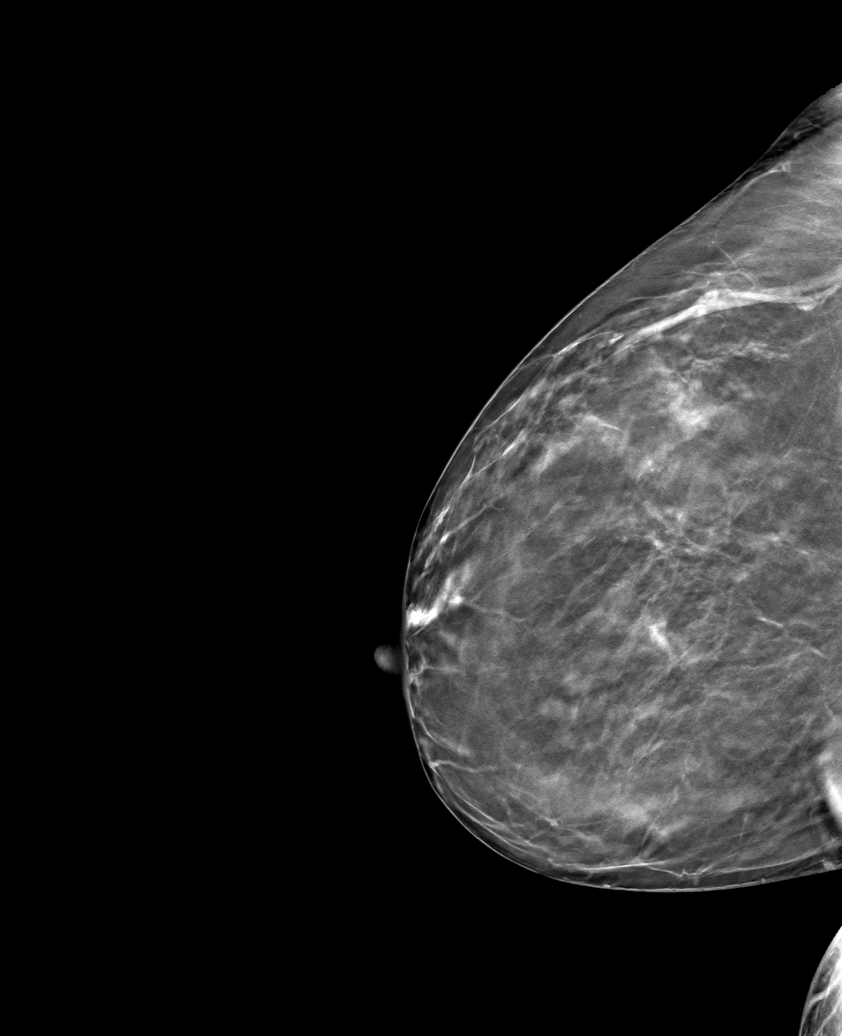

[6 of 18 positions shown; findings below may reference images not displayed]

ACR Breast Density Category b: There are scattered areas of
fibroglandular density.
FINDINGS: The lateral right breast asymmetry resolves on additional imaging.
There is an asymmetry seen medially which persists on additional
imaging.

Mammographic images were processed with CAD.

On physical exam, no suspicious lumps are identified.

Targeted ultrasound is performed, showing an island of dense
glandular tissue at [DATE] which may correlate with the mammographic
finding. No suspicious masses.
IMPRESSION: Probably benign right breast asymmetry.

RECOMMENDATION:
Recommend six-month follow-up mammography of the probably benign
medial right breast asymmetry.

I have discussed the findings and recommendations with the patient.
Results were also provided in writing at the conclusion of the
visit. If applicable, a reminder letter will be sent to the patient
regarding the next appointment.

BI-RADS CATEGORY  3: Probably benign.

## 2020-02-07 ENCOUNTER — Ambulatory Visit
Admission: RE | Admit: 2020-02-07 | Discharge: 2020-02-07 | Disposition: A | Discharge status: Home / Self Care | Payer: BC Managed Care – PPO | Source: Ambulatory Visit | Attending: Nurse Practitioner | Admitting: Nurse Practitioner

## 2020-02-07 ENCOUNTER — Other Ambulatory Visit: Payer: Self-pay | Admitting: Nurse Practitioner

## 2020-02-07 ENCOUNTER — Other Ambulatory Visit: Payer: Self-pay

## 2020-02-07 DIAGNOSIS — Z1231 Encounter for screening mammogram for malignant neoplasm of breast: Secondary | ICD-10-CM
